# Patient Record
Sex: Female | Born: 1940
Health system: Southern US, Community
[De-identification: ages and names within clinical notes are randomized; demographics above are authoritative.]

## PROBLEM LIST (undated history)

## (undated) DIAGNOSIS — M1990 Unspecified osteoarthritis, unspecified site: Secondary | ICD-10-CM

## (undated) DIAGNOSIS — I10 Essential (primary) hypertension: Secondary | ICD-10-CM

## (undated) DIAGNOSIS — Z923 Personal history of irradiation: Secondary | ICD-10-CM

## (undated) DIAGNOSIS — C50919 Malignant neoplasm of unspecified site of unspecified female breast: Secondary | ICD-10-CM

## (undated) DIAGNOSIS — C50511 Malignant neoplasm of lower-outer quadrant of right female breast: Secondary | ICD-10-CM

## (undated) DIAGNOSIS — Z973 Presence of spectacles and contact lenses: Secondary | ICD-10-CM

## (undated) HISTORY — DX: Unspecified osteoarthritis, unspecified site: M19.90

## (undated) HISTORY — DX: Malignant neoplasm of lower-outer quadrant of right female breast: C50.511

## (undated) HISTORY — PX: TOTAL HIP ARTHROPLASTY: SHX124

## (undated) HISTORY — PX: APPENDECTOMY: SHX54

## (undated) HISTORY — DX: Essential (primary) hypertension: I10

## (undated) HISTORY — PX: BREAST LUMPECTOMY: SHX2

## (undated) HISTORY — PX: COLONOSCOPY: SHX174

## (undated) HISTORY — PX: TUBAL LIGATION: SHX77

## (undated) HISTORY — PX: BACK SURGERY: SHX140

## (undated) HISTORY — PX: BREAST BIOPSY: SHX20

---

## 2009-12-19 ENCOUNTER — Encounter: Admission: RE | Admit: 2009-12-19 | Discharge: 2009-12-19 | Payer: Self-pay | Admitting: *Deleted

## 2010-11-25 ENCOUNTER — Other Ambulatory Visit: Payer: Self-pay | Admitting: Family Medicine

## 2010-11-25 DIAGNOSIS — Z1231 Encounter for screening mammogram for malignant neoplasm of breast: Secondary | ICD-10-CM

## 2010-12-25 ENCOUNTER — Ambulatory Visit
Admission: RE | Admit: 2010-12-25 | Discharge: 2010-12-25 | Disposition: A | Discharge status: Home / Self Care | Payer: BLUE CROSS/BLUE SHIELD | Source: Ambulatory Visit | Attending: Family Medicine | Admitting: Family Medicine

## 2010-12-25 DIAGNOSIS — Z1231 Encounter for screening mammogram for malignant neoplasm of breast: Secondary | ICD-10-CM

## 2011-05-20 DIAGNOSIS — Z23 Encounter for immunization: Secondary | ICD-10-CM | POA: Diagnosis not present

## 2011-09-02 DIAGNOSIS — M1710 Unilateral primary osteoarthritis, unspecified knee: Secondary | ICD-10-CM | POA: Diagnosis not present

## 2011-11-23 DIAGNOSIS — Z23 Encounter for immunization: Secondary | ICD-10-CM | POA: Diagnosis not present

## 2011-11-23 DIAGNOSIS — L659 Nonscarring hair loss, unspecified: Secondary | ICD-10-CM | POA: Diagnosis not present

## 2011-11-23 DIAGNOSIS — Z8249 Family history of ischemic heart disease and other diseases of the circulatory system: Secondary | ICD-10-CM | POA: Diagnosis not present

## 2011-11-23 DIAGNOSIS — I10 Essential (primary) hypertension: Secondary | ICD-10-CM | POA: Diagnosis not present

## 2011-11-23 DIAGNOSIS — Z1211 Encounter for screening for malignant neoplasm of colon: Secondary | ICD-10-CM | POA: Diagnosis not present

## 2011-11-23 DIAGNOSIS — Z79899 Other long term (current) drug therapy: Secondary | ICD-10-CM | POA: Diagnosis not present

## 2011-11-23 DIAGNOSIS — E785 Hyperlipidemia, unspecified: Secondary | ICD-10-CM | POA: Diagnosis not present

## 2012-01-04 ENCOUNTER — Other Ambulatory Visit: Payer: Self-pay | Admitting: Family Medicine

## 2012-01-04 DIAGNOSIS — Z1231 Encounter for screening mammogram for malignant neoplasm of breast: Secondary | ICD-10-CM

## 2012-02-04 ENCOUNTER — Ambulatory Visit
Admission: RE | Admit: 2012-02-04 | Discharge: 2012-02-04 | Disposition: A | Discharge status: Home / Self Care | Payer: Medicare Other | Source: Ambulatory Visit | Attending: Family Medicine | Admitting: Family Medicine

## 2012-02-04 DIAGNOSIS — Z1231 Encounter for screening mammogram for malignant neoplasm of breast: Secondary | ICD-10-CM

## 2012-04-06 DIAGNOSIS — Z23 Encounter for immunization: Secondary | ICD-10-CM | POA: Diagnosis not present

## 2012-11-24 DIAGNOSIS — Z13220 Encounter for screening for lipoid disorders: Secondary | ICD-10-CM | POA: Diagnosis not present

## 2012-11-24 DIAGNOSIS — Z0000 Encounter for general adult medical examination without abnormal findings: Secondary | ICD-10-CM | POA: Diagnosis not present

## 2012-12-26 ENCOUNTER — Other Ambulatory Visit: Payer: Self-pay

## 2012-12-26 DIAGNOSIS — Z1231 Encounter for screening mammogram for malignant neoplasm of breast: Secondary | ICD-10-CM

## 2013-01-27 DIAGNOSIS — I10 Essential (primary) hypertension: Secondary | ICD-10-CM | POA: Diagnosis not present

## 2013-01-27 DIAGNOSIS — E785 Hyperlipidemia, unspecified: Secondary | ICD-10-CM | POA: Diagnosis not present

## 2013-01-27 DIAGNOSIS — Z0000 Encounter for general adult medical examination without abnormal findings: Secondary | ICD-10-CM | POA: Diagnosis not present

## 2013-02-09 DIAGNOSIS — H2510 Age-related nuclear cataract, unspecified eye: Secondary | ICD-10-CM | POA: Diagnosis not present

## 2013-02-09 DIAGNOSIS — H5200 Hypermetropia, unspecified eye: Secondary | ICD-10-CM | POA: Diagnosis not present

## 2013-02-09 DIAGNOSIS — H40019 Open angle with borderline findings, low risk, unspecified eye: Secondary | ICD-10-CM | POA: Diagnosis not present

## 2013-02-09 DIAGNOSIS — H25019 Cortical age-related cataract, unspecified eye: Secondary | ICD-10-CM | POA: Diagnosis not present

## 2013-02-10 ENCOUNTER — Ambulatory Visit
Admission: RE | Admit: 2013-02-10 | Discharge: 2013-02-10 | Disposition: A | Discharge status: Home / Self Care | Payer: Medicare Other | Source: Ambulatory Visit

## 2013-02-10 DIAGNOSIS — Z1231 Encounter for screening mammogram for malignant neoplasm of breast: Secondary | ICD-10-CM

## 2013-03-27 DIAGNOSIS — Z23 Encounter for immunization: Secondary | ICD-10-CM | POA: Diagnosis not present

## 2013-03-31 DIAGNOSIS — H40019 Open angle with borderline findings, low risk, unspecified eye: Secondary | ICD-10-CM | POA: Diagnosis not present

## 2013-04-05 DIAGNOSIS — Z1211 Encounter for screening for malignant neoplasm of colon: Secondary | ICD-10-CM | POA: Diagnosis not present

## 2013-04-05 DIAGNOSIS — K5730 Diverticulosis of large intestine without perforation or abscess without bleeding: Secondary | ICD-10-CM | POA: Diagnosis not present

## 2013-11-23 DIAGNOSIS — I10 Essential (primary) hypertension: Secondary | ICD-10-CM | POA: Diagnosis not present

## 2013-11-23 DIAGNOSIS — Z23 Encounter for immunization: Secondary | ICD-10-CM | POA: Diagnosis not present

## 2013-11-23 DIAGNOSIS — E785 Hyperlipidemia, unspecified: Secondary | ICD-10-CM | POA: Diagnosis not present

## 2013-11-23 DIAGNOSIS — IMO0002 Reserved for concepts with insufficient information to code with codable children: Secondary | ICD-10-CM | POA: Diagnosis not present

## 2013-11-23 DIAGNOSIS — Z79899 Other long term (current) drug therapy: Secondary | ICD-10-CM | POA: Diagnosis not present

## 2014-01-15 ENCOUNTER — Other Ambulatory Visit: Payer: Self-pay

## 2014-01-15 DIAGNOSIS — Z1231 Encounter for screening mammogram for malignant neoplasm of breast: Secondary | ICD-10-CM

## 2014-01-17 DIAGNOSIS — L821 Other seborrheic keratosis: Secondary | ICD-10-CM | POA: Diagnosis not present

## 2014-03-06 ENCOUNTER — Ambulatory Visit
Admission: RE | Admit: 2014-03-06 | Discharge: 2014-03-06 | Disposition: A | Discharge status: Home / Self Care | Payer: Medicare Other | Source: Ambulatory Visit

## 2014-03-06 DIAGNOSIS — Z1231 Encounter for screening mammogram for malignant neoplasm of breast: Secondary | ICD-10-CM | POA: Diagnosis not present

## 2014-03-08 ENCOUNTER — Other Ambulatory Visit: Payer: Self-pay | Admitting: Family Medicine

## 2014-03-08 DIAGNOSIS — R928 Other abnormal and inconclusive findings on diagnostic imaging of breast: Secondary | ICD-10-CM

## 2014-03-22 DIAGNOSIS — Z23 Encounter for immunization: Secondary | ICD-10-CM | POA: Diagnosis not present

## 2014-03-26 ENCOUNTER — Other Ambulatory Visit: Payer: Self-pay | Admitting: Family Medicine

## 2014-03-26 ENCOUNTER — Ambulatory Visit
Admission: RE | Admit: 2014-03-26 | Discharge: 2014-03-26 | Disposition: A | Discharge status: Home / Self Care | Payer: Medicare Other | Source: Ambulatory Visit | Attending: Family Medicine | Admitting: Family Medicine

## 2014-03-26 DIAGNOSIS — R928 Other abnormal and inconclusive findings on diagnostic imaging of breast: Secondary | ICD-10-CM

## 2014-03-26 DIAGNOSIS — N63 Unspecified lump in breast: Secondary | ICD-10-CM | POA: Diagnosis not present

## 2014-04-02 DIAGNOSIS — H25013 Cortical age-related cataract, bilateral: Secondary | ICD-10-CM | POA: Diagnosis not present

## 2014-04-02 DIAGNOSIS — H2513 Age-related nuclear cataract, bilateral: Secondary | ICD-10-CM | POA: Diagnosis not present

## 2014-04-02 DIAGNOSIS — H40013 Open angle with borderline findings, low risk, bilateral: Secondary | ICD-10-CM | POA: Diagnosis not present

## 2014-04-05 ENCOUNTER — Other Ambulatory Visit: Payer: Self-pay | Admitting: Family Medicine

## 2014-04-05 ENCOUNTER — Ambulatory Visit
Admission: RE | Admit: 2014-04-05 | Discharge: 2014-04-05 | Disposition: A | Discharge status: Home / Self Care | Payer: Medicare Other | Source: Ambulatory Visit | Attending: Family Medicine | Admitting: Family Medicine

## 2014-04-05 DIAGNOSIS — R928 Other abnormal and inconclusive findings on diagnostic imaging of breast: Secondary | ICD-10-CM

## 2014-04-05 DIAGNOSIS — N63 Unspecified lump in breast: Secondary | ICD-10-CM | POA: Diagnosis not present

## 2014-04-05 DIAGNOSIS — C50511 Malignant neoplasm of lower-outer quadrant of right female breast: Secondary | ICD-10-CM | POA: Diagnosis not present

## 2014-04-06 ENCOUNTER — Other Ambulatory Visit: Payer: Self-pay | Admitting: *Deleted

## 2014-04-06 ENCOUNTER — Telehealth: Payer: Self-pay | Admitting: *Deleted

## 2014-04-06 ENCOUNTER — Encounter: Payer: Self-pay | Admitting: *Deleted

## 2014-04-06 DIAGNOSIS — C50511 Malignant neoplasm of lower-outer quadrant of right female breast: Secondary | ICD-10-CM

## 2014-04-06 HISTORY — DX: Malignant neoplasm of lower-outer quadrant of right female breast: C50.511

## 2014-04-06 NOTE — Progress Notes (Signed)
Pt received date and time for arrival to [facility] on 04/11/14 from [facility]. I have left vm requesting pt to return call for further instructions.

## 2014-04-06 NOTE — Telephone Encounter (Signed)
Confirmed [facility] for 04/11/14 at 0800 .  Instructions and contact information given.

## 2014-04-10 NOTE — Progress Notes (Signed)
[facility] CONSULT NOTE  Patient Care Team: Antony Blackbird, MD as PCP - General (Family Medicine) Autumn Messing III, MD as Consulting Physician (General Surgery) Truitt Merle, MD as Consulting Physician (Hematology) Thea Silversmith, MD as Consulting Physician (Radiation Oncology) Trinda Pascal, NP as Nurse Practitioner (Nurse Practitioner)     Breast cancer of lower-outer quadrant of right female breast   04/06/2014 Initial Diagnosis Breast cancer of lower-outer quadrant of right female breast     CHIEF COMPLAINTS/PURPOSE OF CONSULTATION:  Newly diagnosed breast cancer   HISTORY OF PRESENTING ILLNESS:  Bonnie Maddox 73 y.o. female is here because of newly diagnosed breast cancer.   She had routine mammogram screening on 03/06/14 which showed a possible mass in right mass. She had diagnostic mammogram and [place], and [place] which showed a 19m solid mass 8:30 position of right breast 3cm from the nipple. She underwent guided biopsy which showed invasive ductal carcinoma.   She has good general health and is very physically active. She likes gardening. She feels well without any symptoms.   MEDICAL HISTORY:  Past Medical History  Diagnosis Date  . Breast cancer of lower-outer quadrant of right female breast 04/06/2014  . Hypertension   . Arthritis     SURGICAL HISTORY: Past Surgical History  Procedure Laterality Date  . Appendectomy 8    . Total hip arthroplasty 2001 and 2001      SOCIAL HISTORY: History   Social History  . Marital Status: maried     Spouse Name: PDarlyn Read    Number of Children: 2  . Years of Education: N/A   Occupational History   Retired, pHealth and safety inspector   Social History Main Topics  . Smoking status: Former Smoker -- 1.00 packs/day    Types: Cigarettes    Quit date: 04/11/1969  . Smokeless tobacco: Not on file  . Alcohol Use: Yes     Comment: 21  oz/week  . Drug Use: No  . Sexual Activity: Not on file   GYN G3P2, first  live birth [birth year] one child died in infant  OCP: 2 years  Menarche: 89LMP: 1991 HRT: no   FAMILY HISTORY: Family History  Problem Relation Age of Onset  . Breast cancer Sister 497    ALLERGIES:  has No Known Allergies.  MEDICATIONS:  She takes labetalol for hypertension, and multiple vitamin supplies including fish oil, calcium and vitamin D.  REVIEW OF SYSTEMS:   Constitutional: Denies fevers, chills or abnormal night sweats Eyes: Denies blurriness of vision, double vision or watery eyes Ears, nose, mouth, throat, and face: Denies mucositis or sore throat Respiratory: Denies cough, dyspnea or wheezes Cardiovascular: Denies palpitation, chest discomfort or lower extremity swelling Gastrointestinal:  Denies nausea, heartburn or change in bowel habits Skin: Denies abnormal skin rashes Lymphatics: Denies new lymphadenopathy or easy bruising Neurological:Denies numbness, tingling or new weaknesses Behavioral/Psych: Mood is stable, no new changes  All other systems were reviewed with the patient and are negative.  PHYSICAL EXAMINATION: ECOG PERFORMANCE STATUS: 0 - Asymptomatic  Filed Vitals:   04/11/14 0905  BP: 186/78  Pulse: 61  Temp: 97.5 F (36.4 C)  Resp: 18   Filed Weights   04/11/14 0905  Weight: 227 lb (102.967 kg)    GENERAL:alert, no distress and comfortable SKIN: skin color, texture, turgor are normal, no rashes or significant lesions EYES: normal, conjunctiva are pink and non-injected, sclera clear OROPHARYNX:no exudate, no erythema and lips, buccal mucosa, and tongue  normal  NECK: supple, thyroid normal size, non-tender, without nodularity LYMPH:  no palpable lymphadenopathy in the cervical, axillary or inguinal LUNGS: clear to auscultation and percussion with normal breathing effort HEART: regular rate & rhythm and no murmurs and no lower extremity edema ABDOMEN:abdomen soft, non-tender and normal bowel sounds Musculoskeletal:no cyanosis of digits and no  clubbing  PSYCH: alert & oriented x 3 with fluent speech NEURO: no focal motor/sensory deficits BREAST: Breast inspection showed them to be symmetrical with no nipple discharge. (+) Skin bruise at the biopsy site in right breast, a small (1cm) palpable mass in the right lower outer quadrant breast. Palpation of the left breasts and axilla revealed no obvious mass that I could appreciate.   LABORATORY DATA:  I have reviewed the data as listed Lab Results  Component Value Date   WBC 4.0 04/11/2014   HGB 12.7 04/11/2014   HCT 37.8 04/11/2014   MCV 99.2 04/11/2014   PLT 178 04/11/2014    Recent Labs  04/11/14 0827  NA 143  K 4.3  CO2 26  GLUCOSE 111  BUN 16.0  CREATININE 0.7  CALCIUM 9.5  PROT 6.9  ALBUMIN 4.0  AST 39*  ALT 34  ALKPHOS 73  BILITOT 0.57    PATHOLOGY REPORT Diagnosis 04/05/2014 Breast, right, needle core biopsy, mass, 8:30 o'clock - INVASIVE DUCTAL CARCINOMA. - SEE COMMENT. Microscopic Comment The carcinoma appears grade I. A breast prognostic profile will be performed and the results reported Separately. 90%(+), PR(-), HER2 (-)  ER   RADIOGRAPHIC STUDIES: I have personally reviewed the radiological images as listed and agreed with the findings in the report.  Mm Digital Diagnostic Unilat R 04/10/2014   Ultrasound is performed, showing an irregular hypoechoic mass with indistinct and slightly spiculated margins that measures 4 x 4 x 4 mm (millimeters).     RIGHT MAMMOGRAM  ULTRASOUND RIGHT BREAST 04/05/14 IMPRESSION: 4 mm solid mass 8:30 position right breast 3 cm from the nipple. Appearances of the mass or suspicious for malignancy.  RECOMMENDATION: Ultrasound-guided biopsy is recommended and the procedure was discussed with the patient today.   BI-RADS CATEGORY  4: Suspicious.   Mm Digital Diagnostic Unilat R 04/05/2014   IMPRESSION:  Post biopsy images demonstrate the ribbon clip to be slightly superior and approximately 5 mm medial to the  biopsied mass.  Final Assessment: Post Procedure Mammograms for Marker Placement       ASSESSMENT & PLAN:  Mrs. Kujala is a 73 year old female with past medical history of hypertension and osteoarthritis, but otherwise very healthy and active, who was found to have a right breast 4 mm mass at 8:30 position, biopsy confirmed grade 1 invasive ductal carcinoma, ER 90% positive, PR negative and HER-2 negative, Ki-67 5%. She has stage T1aN0M0  IA disease.  She has met surgeon Dr. Marlou Starks today and likely will have lumpectomy soon. We did discuss genetic counseling, given her family history of breast cancer in her sister at young age (75). She has met radiation oncologist Dr. Pablo Ledger who recommended adjuvant radiation after lumpectomy.   Given the small size of the tumor, low-grade, and strong ER positive, I did not recommend adjuvant chemotherapy. I briefly discussed the role of Oncotype test, however given the low risks, I think the Oncotype more likely will return as low score, so I don't think it's very necessary to test it.  I think she would benefit most from adjuvant hormonal therapy to reduce the cancer recurrence. This has been improved in multiple  clinical studies. She is post menopause I would recommend AI, Arimidex, after she completes adjuvant irradiation.   I plan to see her back 2-3 weeks after her surgery, review her surgical past and discuss adjuvant hormonal therapy at that time again.    All questions were answered. The patient knows to call the clinic with any problems, questions or concerns. I spent 30 minutes counseling the patient face to face. The total time spent in the appointment was 40 minutes and more than 50% was on counseling.     Truitt Merle, MD 04/11/2014 12:27 PM

## 2014-04-11 ENCOUNTER — Ambulatory Visit: Payer: Medicare Other

## 2014-04-11 ENCOUNTER — Other Ambulatory Visit (HOSPITAL_BASED_OUTPATIENT_CLINIC_OR_DEPARTMENT_OTHER): Payer: Medicare Other

## 2014-04-11 ENCOUNTER — Encounter: Payer: Self-pay | Admitting: Hematology

## 2014-04-11 ENCOUNTER — Ambulatory Visit: Payer: Medicare Other | Attending: General Surgery | Admitting: Physical Therapy

## 2014-04-11 ENCOUNTER — Ambulatory Visit
Admission: RE | Admit: 2014-04-11 | Discharge: 2014-04-11 | Disposition: A | Discharge status: Home / Self Care | Payer: Medicare Other | Source: Ambulatory Visit | Attending: Radiation Oncology | Admitting: Radiation Oncology

## 2014-04-11 ENCOUNTER — Encounter: Payer: Self-pay | Admitting: *Deleted

## 2014-04-11 ENCOUNTER — Ambulatory Visit (HOSPITAL_BASED_OUTPATIENT_CLINIC_OR_DEPARTMENT_OTHER): Payer: Medicare Other | Admitting: Hematology

## 2014-04-11 ENCOUNTER — Encounter: Payer: Self-pay | Admitting: Physical Therapy

## 2014-04-11 ENCOUNTER — Other Ambulatory Visit (INDEPENDENT_AMBULATORY_CARE_PROVIDER_SITE_OTHER): Payer: Self-pay | Admitting: General Surgery

## 2014-04-11 VITALS — BP 186/78 | HR 61 | Temp 97.5°F | Resp 18 | Ht 70.5 in | Wt 227.0 lb

## 2014-04-11 DIAGNOSIS — C50511 Malignant neoplasm of lower-outer quadrant of right female breast: Secondary | ICD-10-CM

## 2014-04-11 DIAGNOSIS — I10 Essential (primary) hypertension: Secondary | ICD-10-CM | POA: Diagnosis not present

## 2014-04-11 DIAGNOSIS — Z803 Family history of malignant neoplasm of breast: Secondary | ICD-10-CM

## 2014-04-11 DIAGNOSIS — C50911 Malignant neoplasm of unspecified site of right female breast: Secondary | ICD-10-CM

## 2014-04-11 DIAGNOSIS — Z5189 Encounter for other specified aftercare: Secondary | ICD-10-CM | POA: Diagnosis not present

## 2014-04-11 DIAGNOSIS — M439 Deforming dorsopathy, unspecified: Secondary | ICD-10-CM | POA: Diagnosis not present

## 2014-04-11 DIAGNOSIS — M1990 Unspecified osteoarthritis, unspecified site: Secondary | ICD-10-CM

## 2014-04-11 DIAGNOSIS — Z87891 Personal history of nicotine dependence: Secondary | ICD-10-CM | POA: Insufficient documentation

## 2014-04-11 DIAGNOSIS — Z170 Estrogen receptor positive status [ER+]: Secondary | ICD-10-CM | POA: Insufficient documentation

## 2014-04-11 DIAGNOSIS — Z510 Encounter for antineoplastic radiation therapy: Secondary | ICD-10-CM | POA: Insufficient documentation

## 2014-04-11 LAB — CBC WITH DIFFERENTIAL/PLATELET
BASO%: 0.3 % (ref 0.0–2.0)
Basophils Absolute: 0 10*3/uL (ref 0.0–0.1)
EOS%: 1.5 % (ref 0.0–7.0)
Eosinophils Absolute: 0.1 10*3/uL (ref 0.0–0.5)
HCT: 37.8 % (ref 34.8–46.6)
HGB: 12.7 g/dL (ref 11.6–15.9)
LYMPH%: 36.6 % (ref 14.0–49.7)
MCH: 33.3 pg (ref 25.1–34.0)
MCHC: 33.6 g/dL (ref 31.5–36.0)
MCV: 99.2 fL (ref 79.5–101.0)
MONO#: 0.3 10*3/uL (ref 0.1–0.9)
MONO%: 8.1 % (ref 0.0–14.0)
NEUT#: 2.1 10*3/uL (ref 1.5–6.5)
NEUT%: 53.5 % (ref 38.4–76.8)
Platelets: 178 10*3/uL (ref 145–400)
RBC: 3.81 10*6/uL (ref 3.70–5.45)
RDW: 13.7 % (ref 11.2–14.5)
WBC: 4 10*3/uL (ref 3.9–10.3)
lymph#: 1.5 10*3/uL (ref 0.9–3.3)

## 2014-04-11 LAB — COMPREHENSIVE METABOLIC PANEL (CC13)
ALT: 34 U/L (ref 0–55)
AST: 39 U/L — ABNORMAL HIGH (ref 5–34)
Albumin: 4 g/dL (ref 3.5–5.0)
Alkaline Phosphatase: 73 U/L (ref 40–150)
Anion Gap: 9 mEq/L (ref 3–11)
BUN: 16 mg/dL (ref 7.0–26.0)
CO2: 26 mEq/L (ref 22–29)
Calcium: 9.5 mg/dL (ref 8.4–10.4)
Chloride: 108 mEq/L (ref 98–109)
Creatinine: 0.7 mg/dL (ref 0.6–1.1)
Glucose: 111 mg/dl (ref 70–140)
Potassium: 4.3 mEq/L (ref 3.5–5.1)
Sodium: 143 mEq/L (ref 136–145)
Total Bilirubin: 0.57 mg/dL (ref 0.20–1.20)
Total Protein: 6.9 g/dL (ref 6.4–8.3)

## 2014-04-11 NOTE — Progress Notes (Signed)
Checked in new pt with no financial concerns at this time.  Pt has 2 insurances so financial assistance may not be needed but she had Raquel's card for any questions or concerns.

## 2014-04-11 NOTE — Progress Notes (Signed)
[facility] Psychosocial Distress Screening Clinical Social Work  Patient completed distress screening protocol and scored a 4 on the Psychosocial Distress Thermometer which indicates mild distress. Clinical Social Worker met with patient and patients husband in [facility] to assess for distress and other psychosocial needs.  Patient stated she was doing well and did not express any concerns at this time.  Patient stated she felt comfortable with her treatment plan after meeting with the treatment team.  CSW informed patient of the support services and support team at [facility] and encouraged her to cal with any questions or concerns.      ONCBCN DISTRESS SCREENING 04/11/2014  Screening Type Initial Screening  Distress experienced in past week (1-10) 4  Emotional problem type Adjusting to illness  Physician notified of physical symptoms Yes  Referral to clinical psychology No  Referral to clinical social work No  Referral to dietition No  Referral to support programs Yes   Johnnye Lana, MSW, LCSW, OSW-C Clinical Social Worker [place] [phone]

## 2014-04-11 NOTE — Progress Notes (Addendum)
  Radiation Oncology         [phone]) [phone] ________________________________  Initial outpatient Consultation - Date: 04/11/2014   Name: Bonnie Maddox MRN: [id number]   DOB: 1940-12-25  REFERRING PHYSICIAN: Jovita Kussmaul, MD  STAGE: Breast cancer of lower-outer quadrant of right female breast   Staging form: Breast, AJCC 7th Edition     Clinical stage from 04/11/2014: Stage IA (T1a, N0, M0) - Unsigned  HISTORY OF PRESENT ILLNESS::Bonnie Maddox is a 73 y.o. female who presented for routine mammogram on 10/20. This showed a possible mass in right mass. She had diagnostic mammogram and [place] which showed a 10m solid mass 8:30 position of right breast 3cm from the nipple. A biopsy which showed invasive ductal carcinoma this was ER+ PR- Her 2- with a Ki 67 of 5%.  She has no history of breast cancer. She is G3P2 with her first birth at 89 She had menarche at 89and underwent menopause in 89 She has not used hormone replacement therapy. She has done well since her biopsy and is accompanied by her husband.  They have moved around quite a bit most recently from [place]. Her husband's mother is still alive [place]   PREVIOUS RADIATION THERAPY: No  FAMILY HISTORY:  Family History  Problem Relation Age of Onset  . Breast cancer Sister     SOCIAL HISTORY:  History  Substance Use Topics  . Smoking status: Former Smoker -- 1.00 packs/day    Types: Cigarettes    Quit date: 04/11/1969  . Smokeless tobacco: Not on file  . Alcohol Use: Yes     Comment: 21  oz/week    REVIEW OF SYSTEMS:  A 15 point review of systems is documented in the electronic medical record. This was obtained by the nursing staff. However, I reviewed this with the patient to discuss relevant findings and make appropriate changes.  Pertinent positives are included in the chart.   PHYSICAL EXAM: There were no vitals filed for this visit.. . She is pleasant and appears younger than her stated age.  She is alert and  oriented x 3. She has no palpable abnormalities of the left or right breast. She has no palpable abnormalities of the axilla, cervical or supraclavicular regions. She has biopsy change in the lower inner quadrant with some associated bruising.   IMPRESSION: T1aN0 IDC of the left breast.   PLAN: I spoke to the patient today regarding her diagnosis and options for treatment. We discussed the equivalence in terms of survival and local failure between mastectomy and breast conservation. We discussed the role of radiation in decreasing local failures in patients who undergo lumpectomy. We discussed the process of simulation and the placement tattoos. We discussed 3 weeks of treatment as an outpatient. We discussed the possibility of asymptomatic lung damage. We discussed the low likelihood of secondary malignancies. We discussed the possible side effects including but not limited to skin redness, fatigue, permanent skin darkening, and breast swelling.    She met with surgery, physical therapy and a member of our patient and family support team.   I will meet back with her after her surgery. I told her it would be fine to delay radiation until after the first of the year if she desired.   I spent 60 minutes  face to face with the patient and more than 50% of that time was spent in counseling and/or coordination of care.   ------------------------------------------------  SThea Silversmith MD

## 2014-04-11 NOTE — Therapy (Signed)
Physical Therapy Evaluation  Patient Details  Name: Bonnie Maddox MRN: [id number] Date of Birth: 02-Dec-1940  Encounter Date: 04/11/2014      PT End of Session - 04/11/14 1129    Visit Number 1   Number of Visits 1   PT Start Time 0945   PT Stop Time 1020   PT Time Calculation (min) 35 min   Activity Tolerance Patient tolerated treatment well   Behavior During Therapy [facility] for tasks assessed/performed      Past Medical History  Diagnosis Date  . Breast cancer of lower-outer quadrant of right female breast 04/06/2014  . Hypertension   . Arthritis     Past Surgical History  Procedure Laterality Date  . Appendectomy    . Total hip arthroplasty      There were no vitals taken for this visit.  Visit Diagnosis:  Carcinoma of lower outer quadrant of right breast - Plan: PT plan of care cert/re-cert  Postural deformity - Plan: PT plan of care cert/re-cert      Subjective Assessment - 04/11/14 1115    Symptoms Patient was seen today at the [facility] for a new diagnosis of right breast cancer.   Pertinent History Patient diagnosed with right ER positive / PR negative, HER2 negative breast cancer on 04/06/14.  This is located in her right lower-outer quadrant.   Patient Stated Goals Learn post op home exercise program and reduce lymphedema risk.   Currently in Pain? Yes   Pain Score 5    Pain Location Knee   Pain Orientation Left;Right   Pain Descriptors / Indicators Aching   Pain Type Chronic pain   Pain Onset More than a month ago   Pain Frequency Intermittent   Aggravating Factors  sitting   Pain Relieving Factors moving around   Effect of Pain on Daily Activities Patient reports her knees get very stiff after prolonged sitting          [facility] PT Assessment - 04/11/14 0001    Assessment   Medical Diagnosis right breast cancer   Onset Date 04/06/14   Precautions   Precautions Other (comment)  Active breast cancer; dementia; fall risk   Restrictions   Weight Bearing Restrictions No   Balance Screen   Has the patient fallen in the past 6 months No  She feels unsteady due to knee pain but denies falls   Has the patient had a decrease in activity level because of a fear of falling?  No   Is the patient reluctant to leave their home because of a fear of falling?  No   Home Environment   Living Enviornment Private residence   Living Arrangements Spouse/significant other   Available Help at Discharge Family   Prior Function   Level of Independence Independent with basic ADLs   Vocation Retired  Retired gardener   Leisure gardening; Engineer, maintenance (IT) videos 3-4x/wk; bike   Cognition   Overall Cognitive Status Within Functional Limits for tasks assessed   Posture/Postural Control   Posture/Postural Control Postural limitations   Postural Limitations Forward head;Rounded Shoulders   AROM   Right Shoulder Extension 46 Degrees   Right Shoulder Flexion 154 Degrees   Right Shoulder ABduction 152 Degrees   Right Shoulder Internal Rotation 75 Degrees   Right Shoulder External Rotation 7 Degrees   Left Shoulder Extension 64 Degrees   Left Shoulder Flexion 139 Degrees   Left Shoulder ABduction 140 Degrees   Left Shoulder Internal Rotation 67 Degrees  Left Shoulder External Rotation 72 Degrees            PT Education - 06-May-2014 1128    Education provided Yes   Education Details Post op shoulder ROM home exercises; lymphedema risk reduction practices   Person(s) Educated Patient   Methods Explanation;Demonstration;Verbal cues;Handout   Comprehension Verbalized understanding              Plan - 2014-05-06 1129    Clinical Impression Statement Patient was seen today for a baseline evaluation for right breast cancer.  She is planning to have a right lumpectomy and sentinel node biopsy followed by radiation therapy and anti-estrogen therapy.  She may benefit from physical therapy after surgery to regain shoulder range of  motion or to address lymphedema concerns.   Pt will benefit from skilled therapeutic intervention in order to improve on the following deficits Decreased strength;Impaired UE functional use;Pain;Decreased knowledge of precautions;Increased edema;Decreased range of motion  if needed following surgery.   Rehab Potential Excellent   Clinical Impairments Affecting Rehab Potential None   PT Frequency One time visit   PT Treatment/Interventions Therapeutic exercise;Patient/family education   Consulted and Agree with Plan of Care Patient;Family member/caregiver   Family Member Consulted Patient's husband          G-Codes - 05-06-14 1134    Functional Assessment Tool Used Clinical Judgement   Functional Limitation Other PT primary   Other PT Primary Current Status [phone]) At least 1 percent but less than 20 percent impaired, limited or restricted   Other PT Primary Goal Status (Y6063) At least 1 percent but less than 20 percent impaired, limited or restricted   Other PT Primary Discharge Status (K1601) At least 1 percent but less than 20 percent impaired, limited or restricted      Problem List Patient Active Problem List   Diagnosis Date Noted  . Breast cancer of lower-outer quadrant of right female breast 04/06/2014            LYMPHEDEMA/ONCOLOGY QUESTIONNAIRE - 2014/05/06 1124    Type   Cancer Type Right breast   Lymphedema Assessments   Lymphedema Assessments Upper extremities   Right Upper Extremity Lymphedema   10 cm Proximal to Olecranon Process 30.5 cm   Olecranon Process 27.2 cm   10 cm Proximal to Ulnar Styloid Process 23.9 cm   Just Proximal to Ulnar Styloid Process 17.8 cm   Across Hand at [employer] 20.4 cm   At [place] of 2nd Digit 7.2 cm   Left Upper Extremity Lymphedema   10 cm Proximal to Olecranon Process 31.3 cm   Olecranon Process 27.5 cm   10 cm Proximal to Ulnar Styloid Process 24.1 cm   Just Proximal to Ulnar Styloid Process 17.5 cm   Across Hand at  [employer] 19.8 cm   At [place] of 2nd Digit 6.8 cm            Breast Clinic Goals - May 06, 2014 1133    Patient will be able to verbalize understanding of pertinent lymphedema risk reduction practices relevant to her diagnosis specifically related to skin care.   Time 1   Period Days   Status New   Patient will be able to return demonstrate and/or verbalize understanding of the post-op home exercise program related to regaining shoulder range of motion.   Time 1   Period Days   Status New   Patient will be able to verbalize understanding of the importance of attending the postoperative  After Breast Cancer Class for further lymphedema risk reduction education and therapeutic exercise.   Time 1   Period Days   Status New         Aliesha Dolata,MARTI COOPER, PT 04/11/2014, 11:39 AM

## 2014-04-11 NOTE — Progress Notes (Signed)
Bonnie Maddox is a very pleasant 73 y.o.Marland Kitchen female from [place], [place] with newly diagnosed grade 1 invasive ductal carcinoma of the right breast.  Biopsy results also revealed pathology indicating the tumor is ER positive, PR negative, and HER2/neu negative. Ki67 is 5%.  She presents today with her husband to the [facility]) for treatment consideration and recommendations from the breast surgeon, radiation oncologist, and medical oncologist.     I briefly met with Bonnie Maddox and her husband  during her [facility] visit today. We discussed the purpose of the Survivorship Clinic, which will include monitoring for recurrence, coordinating completion of age and gender-appropriate cancer screenings, promotion of overall wellness, as well as managing potential late/long-term side effects of anti-cancer treatments.    As of today, the treatment plan for Bonnie Maddox will likely include surgery and radiation therapy.  She will also meet with the [employer]. Endocrine therapy with an aromatase inhibitor will be considered for her as well.The intent of treatment for Bonnie Maddox is cure, therefore she will be eligible for the Survivorship Clinic upon her completion of treatment.  Her survivorship care plan (SCP) document will be drafted and updated throughout the course of her treatment trajectory. She will receive the SCP in an office visit with myself in the Survivorship Clinic once she has completed treatment.   Bonnie Maddox was encouraged to ask questions and all questions were answered to her satisfaction.  She was given my business card and encouraged to contact me with any concerns regarding survivorship.  I look forward to  participating in her care.

## 2014-04-11 NOTE — Patient Instructions (Signed)

## 2014-04-16 ENCOUNTER — Telehealth: Payer: Self-pay | Admitting: *Deleted

## 2014-04-16 NOTE — Telephone Encounter (Signed)
Spoke with patient from [facility] 04/11/14.  She is doing well just awaiting her surgery date. Informed her I would check with [facility] about the surgery date. Encouraged her to call with any needs or concerns.

## 2014-04-18 ENCOUNTER — Other Ambulatory Visit (INDEPENDENT_AMBULATORY_CARE_PROVIDER_SITE_OTHER): Payer: Self-pay | Admitting: General Surgery

## 2014-04-18 DIAGNOSIS — C50511 Malignant neoplasm of lower-outer quadrant of right female breast: Secondary | ICD-10-CM

## 2014-04-23 ENCOUNTER — Encounter: Payer: Medicare Other | Admitting: Genetic Counselor

## 2014-04-23 ENCOUNTER — Other Ambulatory Visit: Payer: Medicare Other

## 2014-04-25 ENCOUNTER — Telehealth: Payer: Self-pay | Admitting: *Deleted

## 2014-04-25 NOTE — Telephone Encounter (Signed)
Called and spoke with patient and confirmed follow up appointment with Dr. Burr Medico for 06/07/14 at 1030am.

## 2014-05-14 ENCOUNTER — Encounter (HOSPITAL_BASED_OUTPATIENT_CLINIC_OR_DEPARTMENT_OTHER): Payer: Self-pay | Admitting: *Deleted

## 2014-05-14 NOTE — Progress Notes (Signed)
To come in for ekg-bmet after seeds 12/30

## 2014-05-16 ENCOUNTER — Ambulatory Visit
Admission: RE | Admit: 2014-05-16 | Discharge: 2014-05-16 | Disposition: A | Discharge status: Home / Self Care | Payer: Medicare Other | Source: Ambulatory Visit | Attending: General Surgery | Admitting: General Surgery

## 2014-05-16 ENCOUNTER — Encounter (HOSPITAL_BASED_OUTPATIENT_CLINIC_OR_DEPARTMENT_OTHER)
Admission: RE | Admit: 2014-05-16 | Discharge: 2014-05-16 | Disposition: A | Discharge status: Home / Self Care | Payer: Medicare Other | Source: Ambulatory Visit | Attending: General Surgery | Admitting: General Surgery

## 2014-05-16 DIAGNOSIS — C50511 Malignant neoplasm of lower-outer quadrant of right female breast: Secondary | ICD-10-CM

## 2014-05-16 DIAGNOSIS — R9431 Abnormal electrocardiogram [ECG] [EKG]: Secondary | ICD-10-CM | POA: Diagnosis not present

## 2014-05-16 DIAGNOSIS — M1990 Unspecified osteoarthritis, unspecified site: Secondary | ICD-10-CM | POA: Diagnosis not present

## 2014-05-16 DIAGNOSIS — I10 Essential (primary) hypertension: Secondary | ICD-10-CM | POA: Diagnosis not present

## 2014-05-16 DIAGNOSIS — Z87891 Personal history of nicotine dependence: Secondary | ICD-10-CM | POA: Diagnosis not present

## 2014-05-16 DIAGNOSIS — C50911 Malignant neoplasm of unspecified site of right female breast: Secondary | ICD-10-CM | POA: Diagnosis not present

## 2014-05-16 LAB — BASIC METABOLIC PANEL
Anion gap: 8 (ref 5–15)
BUN: 12 mg/dL (ref 6–23)
CO2: 26 mmol/L (ref 19–32)
Calcium: 9.2 mg/dL (ref 8.4–10.5)
Chloride: 105 mEq/L (ref 96–112)
Creatinine, Ser: 0.52 mg/dL (ref 0.50–1.10)
GFR calc Af Amer: >90 mL/min (ref >90)
GFR calc non Af Amer: >90 mL/min (ref >90)
Glucose, Bld: 93 mg/dL (ref 70–99)
Potassium: 4.5 mmol/L (ref 3.5–5.1)
Sodium: 139 mmol/L (ref 135–145)

## 2014-05-16 NOTE — Progress Notes (Signed)
EKG cleared by Dr Al Corpus

## 2014-05-17 ENCOUNTER — Encounter (HOSPITAL_BASED_OUTPATIENT_CLINIC_OR_DEPARTMENT_OTHER): Payer: Self-pay | Admitting: Anesthesiology

## 2014-05-17 ENCOUNTER — Ambulatory Visit
Admission: RE | Admit: 2014-05-17 | Discharge: 2014-05-17 | Disposition: A | Discharge status: Home / Self Care | Payer: Medicare Other | Source: Ambulatory Visit | Attending: General Surgery | Admitting: General Surgery

## 2014-05-17 ENCOUNTER — Ambulatory Visit (HOSPITAL_BASED_OUTPATIENT_CLINIC_OR_DEPARTMENT_OTHER): Payer: Medicare Other | Admitting: Anesthesiology

## 2014-05-17 ENCOUNTER — Ambulatory Visit (HOSPITAL_BASED_OUTPATIENT_CLINIC_OR_DEPARTMENT_OTHER)
Admission: RE | Admit: 2014-05-17 | Discharge: 2014-05-17 | Disposition: A | Discharge status: Home / Self Care | Payer: Medicare Other | Source: Ambulatory Visit | Attending: General Surgery | Admitting: General Surgery

## 2014-05-17 ENCOUNTER — Encounter (HOSPITAL_COMMUNITY)
Admission: RE | Admit: 2014-05-17 | Discharge: 2014-05-17 | Disposition: A | Discharge status: Home / Self Care | Payer: Medicare Other | Source: Ambulatory Visit | Attending: General Surgery | Admitting: General Surgery

## 2014-05-17 ENCOUNTER — Encounter (HOSPITAL_BASED_OUTPATIENT_CLINIC_OR_DEPARTMENT_OTHER)
Admission: RE | Disposition: A | Discharge status: Home / Self Care | Payer: Self-pay | Source: Ambulatory Visit | Attending: General Surgery

## 2014-05-17 DIAGNOSIS — C50511 Malignant neoplasm of lower-outer quadrant of right female breast: Secondary | ICD-10-CM | POA: Insufficient documentation

## 2014-05-17 DIAGNOSIS — M1990 Unspecified osteoarthritis, unspecified site: Secondary | ICD-10-CM | POA: Diagnosis not present

## 2014-05-17 DIAGNOSIS — I10 Essential (primary) hypertension: Secondary | ICD-10-CM | POA: Insufficient documentation

## 2014-05-17 DIAGNOSIS — G8918 Other acute postprocedural pain: Secondary | ICD-10-CM | POA: Diagnosis not present

## 2014-05-17 DIAGNOSIS — Z87891 Personal history of nicotine dependence: Secondary | ICD-10-CM | POA: Insufficient documentation

## 2014-05-17 DIAGNOSIS — R079 Chest pain, unspecified: Secondary | ICD-10-CM | POA: Diagnosis not present

## 2014-05-17 DIAGNOSIS — R9431 Abnormal electrocardiogram [ECG] [EKG]: Secondary | ICD-10-CM | POA: Diagnosis not present

## 2014-05-17 DIAGNOSIS — C50911 Malignant neoplasm of unspecified site of right female breast: Secondary | ICD-10-CM | POA: Diagnosis not present

## 2014-05-17 DIAGNOSIS — C50912 Malignant neoplasm of unspecified site of left female breast: Secondary | ICD-10-CM | POA: Diagnosis not present

## 2014-05-17 HISTORY — PX: BREAST LUMPECTOMY WITH NEEDLE LOCALIZATION AND AXILLARY SENTINEL LYMPH NODE BX: SHX5760

## 2014-05-17 HISTORY — DX: Presence of spectacles and contact lenses: Z97.3

## 2014-05-17 LAB — POCT HEMOGLOBIN-HEMACUE: Hemoglobin: 13.9 g/dL (ref 12.0–15.0)

## 2014-05-17 SURGERY — BREAST LUMPECTOMY WITH NEEDLE LOCALIZATION AND AXILLARY SENTINEL LYMPH NODE BX
Anesthesia: Regional | Site: Breast | Laterality: Right

## 2014-05-17 MED ORDER — BUPIVACAINE-EPINEPHRINE (PF) 0.5% -1:200000 IJ SOLN
INTRAMUSCULAR | Status: DC | PRN
  Administered 2014-05-17: 30 mL via PERINEURAL

## 2014-05-17 MED ORDER — FENTANYL CITRATE 0.05 MG/ML IJ SOLN
INTRAMUSCULAR | Status: AC
  Filled 2014-05-17: qty 6

## 2014-05-17 MED ORDER — GLYCOPYRROLATE 0.2 MG/ML IJ SOLN
INTRAMUSCULAR | Status: DC | PRN
Start: 2014-05-17 — End: 2014-05-17
  Administered 2014-05-17 (×2): 0.2 mg via INTRAVENOUS

## 2014-05-17 MED ORDER — SODIUM CHLORIDE 0.9 % IJ SOLN
INTRAMUSCULAR | Status: AC
  Filled 2014-05-17: qty 10

## 2014-05-17 MED ORDER — CEFAZOLIN SODIUM-DEXTROSE 2-3 GM-% IV SOLR
INTRAVENOUS | Status: AC
  Filled 2014-05-17: qty 50

## 2014-05-17 MED ORDER — PROPOFOL 10 MG/ML IV BOLUS
INTRAVENOUS | Status: DC | PRN
  Administered 2014-05-17: 200 mg via INTRAVENOUS
  Administered 2014-05-17: 100 mg via INTRAVENOUS

## 2014-05-17 MED ORDER — OXYCODONE-ACETAMINOPHEN 5-325 MG PO TABS
1.0000 | ORAL_TABLET | ORAL | Status: DC | PRN
Start: 2014-05-17 — End: 2014-06-26

## 2014-05-17 MED ORDER — LIDOCAINE HCL (CARDIAC) 20 MG/ML IV SOLN
INTRAVENOUS | Status: DC | PRN
  Administered 2014-05-17: 50 mg via INTRAVENOUS

## 2014-05-17 MED ORDER — TECHNETIUM TC 99M SULFUR COLLOID FILTERED
1.0000 | Freq: Once | INTRAVENOUS | Status: AC | PRN
  Administered 2014-05-17: 1 via INTRADERMAL

## 2014-05-17 MED ORDER — FENTANYL CITRATE 0.05 MG/ML IJ SOLN
INTRAMUSCULAR | Status: AC
  Filled 2014-05-17: qty 2

## 2014-05-17 MED ORDER — LACTATED RINGERS IV SOLN
INTRAVENOUS | Status: DC
  Administered 2014-05-17: 08:00:00 via INTRAVENOUS

## 2014-05-17 MED ORDER — BUPIVACAINE HCL (PF) 0.25 % IJ SOLN
INTRAMUSCULAR | Status: AC
  Filled 2014-05-17: qty 30

## 2014-05-17 MED ORDER — FENTANYL CITRATE 0.05 MG/ML IJ SOLN
50.0000 ug | INTRAMUSCULAR | Status: DC | PRN
  Administered 2014-05-17 (×2): 50 ug via INTRAVENOUS

## 2014-05-17 MED ORDER — BUPIVACAINE HCL (PF) 0.25 % IJ SOLN
INTRAMUSCULAR | Status: DC | PRN
  Administered 2014-05-17: 30 mL

## 2014-05-17 MED ORDER — CEFAZOLIN SODIUM-DEXTROSE 2-3 GM-% IV SOLR
INTRAVENOUS | Status: DC | PRN
  Administered 2014-05-17: 2 g via INTRAVENOUS

## 2014-05-17 MED ORDER — ONDANSETRON HCL 4 MG/2ML IJ SOLN: 4.0000 mg | Freq: Four times a day (QID) | INTRAMUSCULAR | Status: DC | PRN

## 2014-05-17 MED ORDER — DEXAMETHASONE SODIUM PHOSPHATE 4 MG/ML IJ SOLN
INTRAMUSCULAR | Status: DC | PRN
  Administered 2014-05-17: 10 mg via INTRAVENOUS

## 2014-05-17 MED ORDER — CHLORHEXIDINE GLUCONATE 4 % EX LIQD: 1.0000 "application " | Freq: Once | CUTANEOUS | Status: DC

## 2014-05-17 MED ORDER — ONDANSETRON HCL 4 MG/2ML IJ SOLN
INTRAMUSCULAR | Status: DC | PRN
  Administered 2014-05-17: 4 mg via INTRAVENOUS

## 2014-05-17 MED ORDER — CEFAZOLIN SODIUM-DEXTROSE 2-3 GM-% IV SOLR: 2.0000 g | INTRAVENOUS | Status: DC

## 2014-05-17 MED ORDER — BUPIVACAINE-EPINEPHRINE (PF) 0.25% -1:200000 IJ SOLN
INTRAMUSCULAR | Status: AC
  Filled 2014-05-17: qty 30

## 2014-05-17 MED ORDER — METHYLENE BLUE 1 % INJ SOLN
INTRAMUSCULAR | Status: AC
  Filled 2014-05-17: qty 10

## 2014-05-17 MED ORDER — HYDROMORPHONE HCL 1 MG/ML IJ SOLN: 0.2500 mg | INTRAMUSCULAR | Status: DC | PRN

## 2014-05-17 MED ORDER — MIDAZOLAM HCL 2 MG/2ML IJ SOLN
INTRAMUSCULAR | Status: AC
  Filled 2014-05-17: qty 2

## 2014-05-17 MED ORDER — MIDAZOLAM HCL 2 MG/2ML IJ SOLN
1.0000 mg | INTRAMUSCULAR | Status: DC | PRN
  Administered 2014-05-17 (×2): 1 mg via INTRAVENOUS

## 2014-05-17 SURGICAL SUPPLY — 43 items
APPLIER CLIP 11 MED OPEN (CLIP) ×2
BLADE SURG 15 STRL LF DISP TIS (BLADE) ×1 IMPLANT
BLADE SURG 15 STRL SS (BLADE) ×1
CANISTER SUCT 1200ML W/VALVE (MISCELLANEOUS) IMPLANT
CHLORAPREP W/TINT 26ML (MISCELLANEOUS) ×2 IMPLANT
CLIP APPLIE 11 MED OPEN (CLIP) ×1 IMPLANT
COVER BACK TABLE 60X90IN (DRAPES) ×2 IMPLANT
COVER MAYO STAND STRL (DRAPES) ×2 IMPLANT
COVER PROBE W GEL 5X96 (DRAPES) ×2 IMPLANT
DECANTER SPIKE VIAL GLASS SM (MISCELLANEOUS) IMPLANT
DEVICE DUBIN W/COMP PLATE 8390 (MISCELLANEOUS) ×2 IMPLANT
DRAPE LAPAROSCOPIC ABDOMINAL (DRAPES) ×2 IMPLANT
DRAPE UTILITY XL STRL (DRAPES) ×2 IMPLANT
ELECT COATED BLADE 2.86 ST (ELECTRODE) ×2 IMPLANT
ELECT REM PT RETURN 9FT ADLT (ELECTROSURGICAL) ×2
ELECTRODE REM PT RTRN 9FT ADLT (ELECTROSURGICAL) ×1 IMPLANT
GLOVE BIO SURGEON STRL SZ 6.5 (GLOVE) ×4 IMPLANT
GLOVE BIO SURGEON STRL SZ7.5 (GLOVE) ×4 IMPLANT
GLOVE BIOGEL PI IND STRL 7.0 (GLOVE) ×1 IMPLANT
GLOVE BIOGEL PI INDICATOR 7.0 (GLOVE) ×1
GOWN STRL REUS W/ TWL LRG LVL3 (GOWN DISPOSABLE) ×2 IMPLANT
GOWN STRL REUS W/TWL LRG LVL3 (GOWN DISPOSABLE) ×2
KIT MARKER MARGIN INK (KITS) ×2 IMPLANT
LIQUID BAND (GAUZE/BANDAGES/DRESSINGS) ×2 IMPLANT
NDL SAFETY ECLIPSE 18X1.5 (NEEDLE) IMPLANT
NEEDLE HYPO 18GX1.5 SHARP (NEEDLE)
NEEDLE HYPO 25X1 1.5 SAFETY (NEEDLE) ×2 IMPLANT
NS IRRIG 1000ML POUR BTL (IV SOLUTION) ×2 IMPLANT
PACK BASIN DAY SURGERY FS (CUSTOM PROCEDURE TRAY) ×2 IMPLANT
PENCIL BUTTON HOLSTER BLD 10FT (ELECTRODE) ×2 IMPLANT
SLEEVE SCD COMPRESS KNEE MED (MISCELLANEOUS) ×2 IMPLANT
SPONGE LAP 18X18 X RAY DECT (DISPOSABLE) ×2 IMPLANT
STAPLER VISISTAT 35W (STAPLE) IMPLANT
SUT MON AB 4-0 PC3 18 (SUTURE) ×4 IMPLANT
SUT SILK 3 0 PS 1 (SUTURE) IMPLANT
SUT VIC AB 3-0 54X BRD REEL (SUTURE) ×2 IMPLANT
SUT VIC AB 3-0 BRD 54 (SUTURE) ×2
SUT VICRYL 3-0 CR8 SH (SUTURE) ×2 IMPLANT
SYR CONTROL 10ML LL (SYRINGE) ×2 IMPLANT
TOWEL OR 17X24 6PK STRL BLUE (TOWEL DISPOSABLE) ×2 IMPLANT
TOWEL OR NON WOVEN STRL DISP B (DISPOSABLE) IMPLANT
TUBE CONNECTING 20X1/4 (TUBING) IMPLANT
YANKAUER SUCT BULB TIP NO VENT (SUCTIONS) IMPLANT

## 2014-05-17 NOTE — Progress Notes (Signed)
  Assisted Dr. Hodierne with right, ultrasound guided, pectoralis block. Side rails up, monitors on throughout procedure. See vital signs in flow sheet. Tolerated Procedure well. 

## 2014-05-17 NOTE — Anesthesia Postprocedure Evaluation (Signed)
Anesthesia Post Note  Patient: Bonnie Maddox  Procedure(s) Performed: Procedure(s) (LRB): RIGHT BREAST LUMPECTOMY WITH RADIOACTIVE SEED LOCALIZATION AND SENTINEL NODE MAPPING (Right)  Anesthesia type: General  Patient location: PACU  Post pain: Pain level controlled and Adequate analgesia  Post assessment: Post-op Vital signs reviewed, Patient's Cardiovascular Status Stable, Respiratory Function Stable, Patent Airway and Pain level controlled  Last Vitals:  Filed Vitals:   05/17/14 1144  BP: 187/70  Pulse: 61  Temp: 37 C  Resp: 16    Post vital signs: Reviewed and stable  Level of consciousness: awake, alert  and oriented  Complications: No apparent anesthesia complications

## 2014-05-17 NOTE — Progress Notes (Signed)
Nuc med injection performed by Denny Peon. Pt tol well after fentanyl and versed for sedation. VSS (see doc flowsheets)

## 2014-05-17 NOTE — Op Note (Signed)
05/17/2014  10:37 AM  PATIENT:  Bonnie Maddox  73 y.o. female  PRE-OPERATIVE DIAGNOSIS:  Right Breast Cancer  POST-OPERATIVE DIAGNOSIS:  Right Breast Cancer  PROCEDURE:  Procedure(s): RIGHT BREAST LUMPECTOMY WITH RADIOACTIVE SEED LOCALIZATION AND SENTINEL NODE MAPPING (Right)  SURGEON:  Surgeon(s) and Role:    * Jovita Kussmaul, MD - Primary  PHYSICIAN ASSISTANT:   ASSISTANTS: none   ANESTHESIA:   general  EBL:  Total I/O In: 900 [I.V.:900] Out: -   BLOOD ADMINISTERED:none  DRAINS: none   LOCAL MEDICATIONS USED:  MARCAINE     SPECIMEN:  Source of Specimen:  right breast tissue and sentinel node  DISPOSITION OF SPECIMEN:  PATHOLOGY  COUNTS:  YES  TOURNIQUET:  * No tourniquets in log *  DICTATION: .Dragon Dictation  After informed consent was obtained the patient was brought to the operating room and placed in the supine position on the operating room table. After adequate induction of general anesthesia the patient's right chest, breast, and axillary area were prepped with ChloraPrep, allowed to dry, and draped in usual sterile manner. Previously a I-125 radioactive seed had been placed in the lower outer quadrant of the right breast to mark the area of the cancer. Earlier in the day, 1 mCi of technetium sulfur colloid was also placed in the subareolar position on the right. At this point attention was first turned to the right axilla. The neoprobe device set on technetium was used to identify a hot spot in the right axilla. A small transversely oriented incision was made with a 15 blade knife overlying the hot spot. This incision was carried through the skin and subcutaneous tissue sharply with electrocautery until the axilla was entered. Using the neoprobe to direct blunt hemostat dissection a lymph node was identified. The lymph node was excised sharply with electrocautery and the lymphatics were clamped with hemostats, divided, and ligated with 3-0 Vicryl ties. Ex vivo  counts on the lymph node were approximately 200. No other hot or palpable lymph nodes were identified in the right axilla. This was sent as sentinel node #1 to pathology. The wound was infiltrated with quarter percent Marcaine. The deep layer of the wound was then closed with interrupted 0 black stitches. The skin was then closed with a running 4-0 Monocryl subcuticular stitch. Attention was then turned to the right breast. The neoprobe was set to I-125 in the area of radioactivity was identified. A radial type incision was made with a 15 blade knife overlying the hot spot. While frequently checking the position of the radioactivity a circular portion of breast tissue was excised sharply around the area of radioactivity. Once the specimen was removed it was oriented with the appropriate paint colors. The radioactive seed was confirmed in the specimen and a lack of radioactivity was also confirmed in the breast. A specimen radiograph showed the clip and seed to be in the center of the specimen. The specimen was then sent to pathology for further evaluation and we did get confirmation that the seed was present in pathology. The wound was then infiltrated with quarter percent Marcaine. The deep layer of the wound was then closed with interrupted 3-0 Vicryl stitches. The skin was then closed with interrupted 4-0 Monocryl subcuticular stitches. Dermabond dressings were applied. At the end of the case all needle sponge and instrument counts were correct. The patient was then awakened and taken to recovery in stable condition. The patient tolerated the procedure well.  PLAN OF CARE: Discharge to  home after PACU  PATIENT DISPOSITION:  PACU - hemodynamically stable.   Delay start of Pharmacological VTE agent (>24hrs) due to surgical blood loss or risk of bleeding: not applicable

## 2014-05-17 NOTE — Discharge Instructions (Signed)
°  Post Anesthesia Home Care Instructions ° °Activity: °Get plenty of rest for the remainder of the day. A responsible adult should stay with you for 24 hours following the procedure.  °For the next 24 hours, DO NOT: °-Drive a car °-Operate machinery °-Drink alcoholic beverages °-Take any medication unless instructed by your physician °-Make any legal decisions or sign important papers. ° °Meals: °Start with liquid foods such as gelatin or soup. Progress to regular foods as tolerated. Avoid greasy, spicy, heavy foods. If nausea and/or vomiting occur, drink only clear liquids until the nausea and/or vomiting subsides. Call your physician if vomiting continues. ° °Special Instructions/Symptoms: °Your throat may feel dry or sore from the anesthesia or the breathing tube placed in your throat during surgery. If this causes discomfort, gargle with warm salt water. The discomfort should disappear within 24 hours. ° °Regional Anesthesia Blocks ° °1. Numbness or the inability to move the "blocked" extremity may last from 3-48 hours after placement. The length of time depends on the medication injected and your individual response to the medication. If the numbness is not going away after 48 hours, call your surgeon. ° °2. The extremity that is blocked will need to be protected until the numbness is gone and the  Strength has returned. Because you cannot feel it, you will need to take extra care to avoid injury. Because it may be weak, you may have difficulty moving it or using it. You may not know what position it is in without looking at it while the block is in effect. ° °3. For blocks in the legs and feet, returning to weight bearing and walking needs to be done carefully. You will need to wait until the numbness is entirely gone and the strength has returned. You should be able to move your leg and foot normally before you try and bear weight or walk. You will need someone to be with you when you first try to ensure you  do not fall and possibly risk injury. ° °4. Bruising and tenderness at the needle site are common side effects and will resolve in a few days. ° °5. Persistent numbness or new problems with movement should be communicated to the surgeon or the [facility] ([phone])/ [facility] ([phone]). °

## 2014-05-17 NOTE — Anesthesia Procedure Notes (Addendum)
Anesthesia Regional Block:  Pectoralis block  Pre-Anesthetic Checklist: ,, timeout performed, Correct Patient, Correct Site, Correct Laterality, Correct Procedure, Correct Position, site marked, Risks and benefits discussed,  Surgical consent,  Pre-op evaluation,  At surgeon's request and post-op pain management  Laterality: Right  Prep: chloraprep       Needles:  Injection technique: Single-shot  Needle Type: Echogenic Needle     Needle Length: 9cm 9 cm Needle Gauge: 21 and 21 G    Additional Needles:  Procedures: ultrasound guided (picture in chart) Pectoralis block Narrative:  Start time: 05/17/2014 8:52 AM End time: 05/17/2014 9:03 AM Injection made incrementally with aspirations every 5 mL.  Performed by: Personally  Anesthesiologist: HODIERNE, ADAM  Additional Notes: Pt tolerate the procedure well.   Procedure Name: LMA Insertion Date/Time: 05/17/2014 9:29 AM Performed by: Toula Moos L Pre-anesthesia Checklist: Patient identified, Emergency Drugs available, Suction available and Patient being monitored Patient Re-evaluated:Patient Re-evaluated prior to inductionOxygen Delivery Method: Circle System Utilized Preoxygenation: Pre-oxygenation with 100% oxygen Intubation Type: IV induction Ventilation: Mask ventilation without difficulty LMA: LMA inserted LMA Size: 4.0 Number of attempts: 1 Airway Equipment and Method: bite block Placement Confirmation: positive ETCO2 Tube secured with: Tape Dental Injury: Teeth and Oropharynx as per pre-operative assessment

## 2014-05-17 NOTE — H&P (Signed)
Bonnie Maddox. Silliman 04/11/2014 9:44 AM Location: [facility] Patient #: [id number] DOB: 10-25-1940 Undefined / Language: Suszanne Conners / Race: Undefined Female  History of Present Illness Sammuel Hines. Marlou Starks MD; 04/11/2014 9:45 AM) The patient is a 73 year old female who presents with breast cancer. We are asked to see the patient in consultation by Dr. Luberta Robertson to evaluate her for a new right breast cancer. The patient is a 73 year old white female who recently went for a routine screening mammogram. At that time she was found to have a abnormality in the lower outer quadrant of the right breast. This measured 4 mm by ultrasound. It was biopsied and came back as an invasive breast cancer. It was a grade 1 ER positive PR negative HER-2 negative with a Ki-67 of 5%. She denies any breast pain or discharge from her nipple. She does have a sister that has had bilateral breast cancer in [place]. Her sister is a smoker. The patient herself quit smoking when she was 73 years old.   Review of Systems Eddie Dibbles S. Marlou Starks MD; 04/11/2014 9:45 AM) General Not Present- Appetite Loss, Chills, Fatigue, Fever, Night Sweats, Weight Gain and Weight Loss. Skin Not Present- Change in Wart/Mole, Dryness, Hives, Jaundice, New Lesions, Non-Healing Wounds, Rash and Ulcer. HEENT Not Present- Earache, Hearing Loss, Hoarseness, Nose Bleed, Oral Ulcers, Ringing in the Ears, Seasonal Allergies, Sinus Pain, Sore Throat, Visual Disturbances, Wears glasses/contact lenses and Yellow Eyes. Respiratory Not Present- Bloody sputum, Chronic Cough, Difficulty Breathing, Snoring and Wheezing. Breast Not Present- Breast Mass, Breast Pain, Nipple Discharge and Skin Changes. Cardiovascular Not Present- Chest Pain, Difficulty Breathing Lying Down, Leg Cramps, Palpitations, Rapid Heart Rate, Shortness of Breath and Swelling of Extremities. Gastrointestinal Not Present- Abdominal Pain, Bloating, Bloody Stool, Change in Bowel Habits, Chronic  diarrhea, Constipation, Difficulty Swallowing, Excessive gas, Gets full quickly at meals, Hemorrhoids, Indigestion, Nausea, Rectal Pain and Vomiting. Female Genitourinary Not Present- Frequency, Nocturia, Painful Urination, Pelvic Pain and Urgency. Musculoskeletal Not Present- Back Pain, Joint Pain, Joint Stiffness, Muscle Pain, Muscle Weakness and Swelling of Extremities. Neurological Not Present- Decreased Memory, Fainting, Headaches, Numbness, Seizures, Tingling, Tremor, Trouble walking and Weakness. Psychiatric Not Present- Anxiety, Bipolar, Change in Sleep Pattern, Depression, Fearful and Frequent crying. Endocrine Not Present- Cold Intolerance, Excessive Hunger, Hair Changes, Heat Intolerance, Hot flashes and New Diabetes. Hematology Not Present- Easy Bruising, Excessive bleeding, Gland problems, HIV and Persistent Infections.   Physical Exam Eddie Dibbles S. Marlou Starks MD; 04/11/2014 9:46 AM) General Mental Status-Alert. General Appearance-Consistent with stated age. Hydration-Well hydrated. Voice-Normal.  Head and Neck Head-normocephalic, atraumatic with no lesions or palpable masses. Trachea-midline. Thyroid Gland Characteristics - normal size and consistency.  Eye Eyeball - Bilateral-Extraocular movements intact. Sclera/Conjunctiva - Bilateral-No scleral icterus.  Chest and Lung Exam Chest and lung exam reveals -quiet, even and easy respiratory effort with no use of accessory muscles and on auscultation, normal breath sounds, no adventitious sounds and normal vocal resonance. Inspection Chest Wall - Normal. Back - normal.  Breast Note: There is no palpable mass in either breast. There is no palpable axillary, supraclavicular, or cervical lymphadenopathy.   Cardiovascular Cardiovascular examination reveals -normal heart sounds, regular rate and rhythm with no murmurs and normal pedal pulses bilaterally.  Abdomen Inspection Inspection of the abdomen reveals - No  Hernias. Skin - Scar - no surgical scars. Palpation/Percussion Palpation and Percussion of the abdomen reveal - Soft, Non Tender, No Rebound tenderness, No Rigidity (guarding) and No hepatosplenomegaly. Auscultation Auscultation of the abdomen reveals - Bowel  sounds normal.  Neurologic Neurologic evaluation reveals -alert and oriented x 3 with no impairment of recent or remote memory. Mental Status-Normal.  Musculoskeletal Normal Exam - Left-Upper Extremity Strength Normal and Lower Extremity Strength Normal. Normal Exam - Right-Upper Extremity Strength Normal and Lower Extremity Strength Normal.  Lymphatic Head & Neck  General Head & Neck Lymphatics: Bilateral - Description - Normal. Axillary  General Axillary Region: Bilateral - Description - Normal. Tenderness - Non Tender. Femoral & Inguinal  Generalized Femoral & Inguinal Lymphatics: Bilateral - Description - Normal. Tenderness - Non Tender.    Assessment & Plan Eddie Dibbles S. Marlou Starks MD; 04/11/2014 9:47 AM) PRIMARY CANCER OF LOWER OUTER QUADRANT OF RIGHT FEMALE BREAST (174.5  C50.511) Impression: The patient appears to have a small stage I cancer in the lower outer portion of the right breast. I have talked to her in detail about the different options for treatment including mastectomy versus breast conservation and she favors breast conservation. She is also a very good candidate for sentinel node mapping. I have discussed with her in detail the risks and benefits of the operation to do this as well as some of the technical aspects and she understands and wishes to proceed. I will plan for a right breast radioactive seed localized lumpectomy and sentinel node biopsy     Signed by Luella Cook, MD (04/11/2014 9:48 AM)

## 2014-05-17 NOTE — Interval H&P Note (Signed)
History and Physical Interval Note:  05/17/2014 6:59 AM  Bonnie Maddox  has presented today for surgery, with the diagnosis of Right Breast Cancer  The various methods of treatment have been discussed with the patient and family. After consideration of risks, benefits and other options for treatment, the patient has consented to  Procedure(s): RIGHT BREAST LUMPECTOMY WITH RADIOACTIVE SEED LOCALIZATION AND SENTINEL NODE MAPPING (Right) as a surgical intervention .  The patient's history has been reviewed, patient examined, no change in status, stable for surgery.  I have reviewed the patient's chart and labs.  Questions were answered to the patient's satisfaction.     TOTH III,Samariyah Cowles S

## 2014-05-17 NOTE — Anesthesia Preprocedure Evaluation (Signed)
Anesthesia Evaluation  Patient identified by MRN, date of birth, ID band Patient awake    Reviewed: Allergy & Precautions, H&P , NPO status , Patient's Chart, lab work & pertinent test results  Airway Mallampati: II   Neck ROM: full    Dental   Pulmonary former smoker,          Cardiovascular hypertension,     Neuro/Psych    GI/Hepatic   Endo/Other  obese  Renal/GU      Musculoskeletal  (+) Arthritis -,   Abdominal   Peds  Hematology   Anesthesia Other Findings   Reproductive/Obstetrics                             Anesthesia Physical Anesthesia Plan  ASA: II  Anesthesia Plan: General and Regional   Post-op Pain Management: MAC Combined w/ Regional for Post-op pain   Induction: Intravenous  Airway Management Planned: LMA  Additional Equipment:   Intra-op Plan:   Post-operative Plan:   Informed Consent: I have reviewed the patients History and Physical, chart, labs and discussed the procedure including the risks, benefits and alternatives for the proposed anesthesia with the patient or authorized representative who has indicated his/her understanding and acceptance.     Plan Discussed with: CRNA, Anesthesiologist and Surgeon  Anesthesia Plan Comments:         Anesthesia Quick Evaluation

## 2014-05-17 NOTE — Transfer of Care (Signed)
Immediate Anesthesia Transfer of Care Note  Patient: Bonnie Maddox  Procedure(s) Performed: Procedure(s): RIGHT BREAST LUMPECTOMY WITH RADIOACTIVE SEED LOCALIZATION AND SENTINEL NODE MAPPING (Right)  Patient Location: PACU  Anesthesia Type:GA combined with regional for post-op pain  Level of Consciousness: awake, oriented and patient cooperative  Airway & Oxygen Therapy: Patient Spontanous Breathing and Patient connected to face mask oxygen  Post-op Assessment: Report given to PACU RN and Post -op Vital signs reviewed and stable  Post vital signs: Reviewed and stable  Complications: No apparent anesthesia complications

## 2014-05-21 ENCOUNTER — Encounter (HOSPITAL_BASED_OUTPATIENT_CLINIC_OR_DEPARTMENT_OTHER): Payer: Self-pay | Admitting: General Surgery

## 2014-06-01 ENCOUNTER — Encounter: Payer: Self-pay | Admitting: Radiation Oncology

## 2014-06-01 NOTE — Progress Notes (Signed)
Location of Breast Cancer:right breast upper-outer  Histology per Pathology Report:  05/17/2014 Diagnosis 1. Lymph node, sentinel, biopsy, Right axilla - THERE IS NO EVIDENCE OF CARCINOMA IN 1 OF 1 LYMPH NODE (0/1). 2. Breast, lumpectomy, Right - INVASIVE DUCTAL CARCINOMA WITH CALCIFICATIONS, GRADE I/III, SPANNING 0.6 CM. - INVASIVE CARCINOMA IS FOCALLY LESS THAN 0.1 CM TO THE INFERIOR MARGIN. - SEE ONCOLOGY TABLE BELOW. Microscopic Comment 2. BREAST, INVASIVE TUMOR, WITH LYMPH NODES PRESENT 1 of 11 11INAL DIAGNOSIS Diagnosis 04/05/14 Breast, right, needle core biopsy, mass, 8:30 o'clock - INVASIVE DUCTAL CARCINOMA. - SEE COMMENT.  Receptor Status: ER(+), PR (+), Her2-neu (-)  Found on screening mammography.  Past/Anticipated interventions by surgeon, if [facility]:JWTGR lumpectomy  Past/Anticipated interventions by medical oncology, if any: Chemotherapy. Scheduled for consultation with Dr.Feng today at 10:45 am.  Lymphedema issues, if any: No  Pain issues, if any:No  SAFETY ISSUES:  Prior radiation? No states she had some type of light therapy for eczema  Pacemaker/ICD? No  Possible current pregnancy?No  Is the patient on methotrexate?No   Current Complaints / other details:G3P2 Menarche age 88, first pregnancy age 77, menopause in 38.No HRT Quit smoking in 1970.    Arlyss Repress, RN 06/01/2014,8:28 AM

## 2014-06-07 ENCOUNTER — Encounter: Payer: Self-pay | Admitting: Radiation Oncology

## 2014-06-07 ENCOUNTER — Ambulatory Visit (HOSPITAL_BASED_OUTPATIENT_CLINIC_OR_DEPARTMENT_OTHER): Payer: Medicare Other | Admitting: Hematology

## 2014-06-07 ENCOUNTER — Telehealth: Payer: Self-pay | Admitting: Hematology

## 2014-06-07 ENCOUNTER — Ambulatory Visit
Admission: RE | Admit: 2014-06-07 | Discharge: 2014-06-07 | Disposition: A | Discharge status: Home / Self Care | Payer: Medicare Other | Source: Ambulatory Visit | Attending: Radiation Oncology | Admitting: Radiation Oncology

## 2014-06-07 VITALS — BP 149/86 | HR 66 | Temp 97.8°F | Wt 232.7 lb

## 2014-06-07 VITALS — BP 165/70 | HR 61 | Resp 18 | Ht 70.5 in | Wt 231.0 lb

## 2014-06-07 DIAGNOSIS — C50511 Malignant neoplasm of lower-outer quadrant of right female breast: Secondary | ICD-10-CM

## 2014-06-07 DIAGNOSIS — Z87891 Personal history of nicotine dependence: Secondary | ICD-10-CM | POA: Diagnosis not present

## 2014-06-07 DIAGNOSIS — Z803 Family history of malignant neoplasm of breast: Secondary | ICD-10-CM | POA: Diagnosis not present

## 2014-06-07 DIAGNOSIS — Z170 Estrogen receptor positive status [ER+]: Secondary | ICD-10-CM | POA: Diagnosis not present

## 2014-06-07 DIAGNOSIS — Z510 Encounter for antineoplastic radiation therapy: Secondary | ICD-10-CM | POA: Diagnosis not present

## 2014-06-07 NOTE — Addendum Note (Signed)
Encounter addended by: Arlyss Repress, RN on: 06/07/2014 11:53 AM<BR>     Documentation filed: Charges VN

## 2014-06-07 NOTE — Progress Notes (Signed)
   Department of Radiation Oncology  Phone:  [phone] Fax:        [phone]   Name: Bonnie Maddox MRN: [id number]  DOB: [birth year]  Date: 06/07/2014  Follow Up Visit Note  Diagnosis: Breast cancer of lower-outer quadrant of right female breast   Staging form: Breast, AJCC 7th Edition     Clinical stage from 04/11/2014: Stage IA (T1a, N0, M0) - Unsigned  Interval History: Bonnie Maddox presents today for routine followup.  She had her lumpectomy on 12/31. This showed an 6 mm invasive cancer with a focally close inferior margin. Tumor was ER and PR positive. She has recovered well from her surgery. She has great range of motion in her arm. She meets with Dr. Burr Medico today to discuss antiestrogen treatment.   Physical Exam:  Filed Vitals:   06/07/14 0937  BP: 149/86  Pulse: 66  Temp: 97.8 F (36.6 C)  Weight: 232 lb 11.2 oz (105.552 kg)  SpO2: 100%   Pleasant female. No distress.   IMPRESSION: Bonnie Maddox is a 74 y.o. female s/p lumpectomy for an early stage breast cancer  PLAN:  I spoke to the patient today regarding her diagnosis and options for treatment. We discussed the role of radiation in decreasing local failures in patients who undergo lumpectomy. At her age, I would recommend at least one further treatment either radiation or hormones but likely not both unless she desires both. We discussed the process of simulation and the placement tattoos. We discussed 3 weeks of treatment as an outpatient. We discussed the possibility of asymptomatic lung damage. We discussed the low likelihood of secondary malignancies. We discussed the possible side effects including but not limited to skin redness, fatigue, permanent skin darkening, and breast swelling.   I have scheduled her for simulation next Wednesday. She has signed informed consent.    Thea Silversmith, MD

## 2014-06-07 NOTE — Progress Notes (Signed)
[place] NOTE  Patient Care Team: Antony Blackbird, MD as PCP - General (Family Medicine) Autumn Messing III, MD as Consulting Physician (General Surgery) Truitt Merle, MD as Consulting Physician (Hematology) Thea Silversmith, MD as Consulting Physician (Radiation Oncology) Trinda Pascal, NP as Nurse Practitioner (Nurse Practitioner)     Breast cancer of lower-outer quadrant of right female breast   04/06/2014 Initial Diagnosis Breast cancer of lower-outer quadrant of right female breast     CHIEF COMPLAINTS Follow up breast cancer   INTERIM HISTORY:  Bonnie Maddox returns for follow-up. She is doing very well overall. She denies any pain, or other symptoms. She has good energy level and remains to be physically active.  MEDICAL HISTORY:  Past Medical History  Diagnosis Date  . Breast cancer of lower-outer quadrant of right female breast 04/06/2014  . Hypertension   . Arthritis   . Wears glasses     SURGICAL HISTORY: Past Surgical History  Procedure Laterality Date  . Appendectomy 7    . Total hip arthroplasty 2001 and 2001      SOCIAL HISTORY: History   Social History  . Marital Status: maried     Spouse Name: [name]     Number of Children: 2  . Years of Education: N/A   Occupational History   Retired, Health and safety inspector    Social History Main Topics  . Smoking status: Former Smoker -- 1.00 packs/day    Types: Cigarettes    Quit date: 04/11/1969  . Smokeless tobacco: Not on file  . Alcohol Use: Yes     Comment: 21  oz/week  . Drug Use: No  . Sexual Activity: Not on file   GYN G3P2, first live birth 39, one child died in infant  OCP: 2 years  Menarche: 65 LMP: 1991 HRT: no   FAMILY HISTORY: Family History  Problem Relation Age of Onset  . Breast cancer Sister 70     ALLERGIES:  has no allergies on file.  MEDICATIONS:  She takes labetalol for hypertension, and multiple vitamin supplies including fish oil, calcium and  vitamin D.  REVIEW OF SYSTEMS:   Constitutional: Denies fevers, chills or abnormal night sweats Eyes: Denies blurriness of vision, double vision or watery eyes Ears, nose, mouth, throat, and face: Denies mucositis or sore throat Respiratory: Denies cough, dyspnea or wheezes Cardiovascular: Denies palpitation, chest discomfort or lower extremity swelling Gastrointestinal:  Denies nausea, heartburn or change in bowel habits Skin: Denies abnormal skin rashes Lymphatics: Denies new lymphadenopathy or easy bruising Neurological:Denies numbness, tingling or new weaknesses Behavioral/Psych: Mood is stable, no new changes  All other systems were reviewed with the patient and are negative.  PHYSICAL EXAMINATION: ECOG PERFORMANCE STATUS: 0 - Asymptomatic  Filed Vitals:   06/07/14 1142  BP: 165/70  Pulse: 61  Resp: 18   Filed Weights   06/07/14 1142  Weight: 231 lb (104.781 kg)    GENERAL:alert, no distress and comfortable SKIN: skin color, texture, turgor are normal, no rashes or significant lesions EYES: normal, conjunctiva are pink and non-injected, sclera clear OROPHARYNX:no exudate, no erythema and lips, buccal mucosa, and tongue normal  NECK: supple, thyroid normal size, non-tender, without nodularity LYMPH:  no palpable lymphadenopathy in the cervical, axillary or inguinal LUNGS: clear to auscultation and percussion with normal breathing effort HEART: regular rate & rhythm and no murmurs and no lower extremity edema ABDOMEN:abdomen soft, non-tender and normal bowel sounds Musculoskeletal:no cyanosis of digits and no clubbing  PSYCH: alert &  oriented x 3 with fluent speech NEURO: no focal motor/sensory deficits BREAST: Breast inspection showed them to be symmetrical with no nipple discharge. The surgical incisionS at the right breast and axillar are well-healed, no palpable mass or lymphadenopathy.  LABORATORY DATA:  I have reviewed the data as listed Lab Results  Component  Value Date   WBC 4.0 04/11/2014   HGB 13.9 05/17/2014   HCT 37.8 04/11/2014   MCV 99.2 04/11/2014   PLT 178 04/11/2014    Recent Labs  04/11/14 0827 05/16/14 1300  NA 143 139  K 4.3 4.5  CL  --  105  CO2 26 26  GLUCOSE 111 93  BUN 16.0 12  CREATININE 0.7 0.52  CALCIUM 9.5 9.2  GFRNONAA  --  >90  GFRAA  --  >90  PROT 6.9  --   ALBUMIN 4.0  --   AST 39*  --   ALT 34  --   ALKPHOS 73  --   BILITOT 0.57  --     PATHOLOGY REPORT 1. Lymph node, sentinel, biopsy, Right axilla - THERE IS NO EVIDENCE OF CARCINOMA IN 1 OF 1 LYMPH NODE (0/1). 2. Breast, lumpectomy, Right - INVASIVE DUCTAL CARCINOMA WITH CALCIFICATIONS, GRADE I/III, SPANNING 0.6 CM. - INVASIVE CARCINOMA IS FOCALLY LESS Specimen, including laterality and lymph node sampling (sentinel, non-sentinel): Right breast and right axillary sentinel node. Procedure: Seed localized lumpectomy and right axillary sentinel lymph node resection x 1. Histologic type: Ductal. Grade: I. Tubule formation: 1 Nuclear pleomorphism: 2 Mitotic: 1 Tumor size (gross measurement): 0.6 cm. Margins: Invasive, distance to closest margin: Focally less than 0.1 cm to the inferior margin (glass slide measurement). Lymphovascular invasion: Not identified. Ductal carcinoma in situ: Not identified. Lobular neoplasia: Not identified. Tumor focality: Unifocal. Treatment effect: N/A. Extent of tumor: Confined to breast parenchyma. Lymph nodes: Examined: 1 Sentinel 0 Non-sentinel 1 Total Lymph nodes with metastasis: 0. Breast prognostic profile: [phone]. Estrogen receptor: 100%, strong staining intensity. Progesterone receptor: 0%. Her 2 neu: No amplification was detected. The ratio was 1.67. Her 2 neu by [facility] will be repeated on the current case and the results reported separately. Ki-67: 4%. Non-neoplastic breast: Healing biopsy site. TNM: pT1b, pN0. (JBK:ds 05/21/14)  RADIOGRAPHIC STUDIES: I have personally reviewed the  radiological images as listed and agreed with the findings in the report.  Mm Digital Diagnostic Unilat R 04/10/2014   Ultrasound is performed, showing an irregular hypoechoic mass with indistinct and slightly spiculated margins that measures 4 x 4 x 4 mm (millimeters).     RIGHT MAMMOGRAM  ULTRASOUND RIGHT BREAST 04/05/14 IMPRESSION: 4 mm solid mass 8:30 position right breast 3 cm from the nipple. Appearances of the mass or suspicious for malignancy.  RECOMMENDATION: Ultrasound-guided biopsy is recommended and the procedure was discussed with the patient today.   BI-RADS CATEGORY  4: Suspicious.   Mm Digital Diagnostic Unilat R 04/05/2014   IMPRESSION:  Post biopsy images demonstrate the ribbon clip to be slightly superior and approximately 5 mm medial to the biopsied mass.  Final Assessment: Post Procedure Mammograms for Marker Placement       ASSESSMENT & PLAN:  Mrs. Monforte is a 74 year old female with past medical history of hypertension and osteoarthritis, but otherwise very healthy and active, who was found to have a right breast 4 mm mass at 8:30 position, biopsy confirmed grade 1 invasive ductal carcinoma.   1. Right breast invasive ductal carcinoma, T1b N0 M0, stage IA, ER 100% positive, PR negative, HER-2  negative, grade 1, Ki-67 4%  -She has had lumpectomy with negative surgical margins. She is likely cured for her early stage breast cancer  -Giving the small size of the breast cancer, G1, low Ki-67 , strong ER positivity, I do not recommend adjuvant chemotherapy.  -She will proceed adjuvant radiation soon -I recommend  adjuvant hormonal therapy to reduce the cancer recurrence. This has been improved in multiple clinical studies. She is post menopause I would recommend AI, after she completes adjuvant irradiation. She does have arthritis related pain, I recommend Aromasin.  Genetics: She has a strong family history of breast cancer (sister had breast cancer at age of 17), genetic  counseling was recommended.   Plan -proceed RT next week -RTC and start Aromasin in 5 weeks   All questions were answered. The patient knows to call the clinic with any problems, questions or concerns. I spent 20 minutes counseling the patient face to face. The total time spent in the appointment was 30 minutes and more than 50% was on counseling.     Truitt Merle, MD 06/07/2014 11:53 AM

## 2014-06-07 NOTE — Progress Notes (Signed)
Please see the Nurse Progress Note in the MD Initial Consult Encounter for this patient. 

## 2014-06-07 NOTE — Telephone Encounter (Signed)
Patient confirmed appointment.

## 2014-06-08 ENCOUNTER — Encounter: Payer: Self-pay | Admitting: Hematology

## 2014-06-14 ENCOUNTER — Ambulatory Visit
Admission: RE | Admit: 2014-06-14 | Discharge: 2014-06-14 | Disposition: A | Discharge status: Home / Self Care | Payer: Medicare Other | Source: Ambulatory Visit | Attending: Radiation Oncology | Admitting: Radiation Oncology

## 2014-06-14 DIAGNOSIS — C50511 Malignant neoplasm of lower-outer quadrant of right female breast: Secondary | ICD-10-CM | POA: Diagnosis not present

## 2014-06-14 DIAGNOSIS — Z87891 Personal history of nicotine dependence: Secondary | ICD-10-CM | POA: Diagnosis not present

## 2014-06-14 DIAGNOSIS — Z170 Estrogen receptor positive status [ER+]: Secondary | ICD-10-CM | POA: Diagnosis not present

## 2014-06-14 DIAGNOSIS — Z510 Encounter for antineoplastic radiation therapy: Secondary | ICD-10-CM | POA: Diagnosis not present

## 2014-06-14 NOTE — Progress Notes (Signed)
Name: SHERINA STAMMER   MRN: [id number]  Date:  06/14/2014  DOB: [birth year]  Status:outpatient    DIAGNOSIS: Breast cancer.  CONSENT VERIFIED: yes   SET UP: Patient is setup supine   IMMOBILIZATION:  The following immobilization was used:Custom Moldable Pillow, breast board.   NARRATIVE: Ms. Raffel was brought to the [facility].  Identity was confirmed.  All relevant records and images related to the planned course of therapy were reviewed.  Then, the patient was positioned in a stable reproducible clinical set-up for radiation therapy.  Wires were placed to delineate the clinical extent of breast tissue. A wire was placed on the scar as well.  CT images were obtained.  An isocenter was placed. Skin markings were placed.  The CT images were loaded into the planning software where the target and avoidance structures were contoured.  The radiation prescription was entered and confirmed. The patient was discharged in stable condition and tolerated simulation well.    TREATMENT PLANNING NOTE:  Treatment planning then occurred. I have requested : MLC's, isodose plan, basic dose calculation  I personally designed and supervised the construction of 3 medically necessary complex treatment devices for the protection of critical normal structures including the lungs and contralateral breast as well as the immobilization device which is necessary for set up certainty.   3D simulation occurred. I requested and analyzed a dose volume histogram of the heart, lungs and lumpectomy cavity.

## 2014-06-14 NOTE — Progress Notes (Signed)
Radiation Oncology         [phone]) [phone] ________________________________  Name: Bonnie Maddox      MRN: [id number]          Date: 06/14/2014              DOB: Apr 26, 1941  Optical Surface Tracking Plan:  Since intensity modulated radiotherapy (IMRT) and 3D conformal radiation treatment methods are predicated on accurate and precise positioning for treatment, intrafraction motion monitoring is medically necessary to ensure accurate and safe treatment delivery.  The ability to quantify intrafraction motion without excessive ionizing radiation dose can only be performed with optical surface tracking. Accordingly, surface imaging offers the opportunity to obtain 3D measurements of patient position throughout IMRT and 3D treatments without excessive radiation exposure.  I am ordering optical surface tracking for this patient's upcoming course of radiotherapy. ________________________________ Signature   Reference:   Ursula Alert, J, et al. Surface imaging-based analysis of intrafraction motion for breast radiotherapy patients.Journal of [place], n. 6, nov. 2014. ISSN [id number].   Available at: <http://www.jacmp.org/index.php/jacmp/article/view/4957>.

## 2014-06-19 DIAGNOSIS — Z87891 Personal history of nicotine dependence: Secondary | ICD-10-CM | POA: Diagnosis not present

## 2014-06-19 DIAGNOSIS — Z510 Encounter for antineoplastic radiation therapy: Secondary | ICD-10-CM | POA: Diagnosis not present

## 2014-06-19 DIAGNOSIS — C50511 Malignant neoplasm of lower-outer quadrant of right female breast: Secondary | ICD-10-CM | POA: Diagnosis not present

## 2014-06-19 DIAGNOSIS — Z170 Estrogen receptor positive status [ER+]: Secondary | ICD-10-CM | POA: Diagnosis not present

## 2014-06-20 DIAGNOSIS — Z510 Encounter for antineoplastic radiation therapy: Secondary | ICD-10-CM | POA: Diagnosis not present

## 2014-06-21 ENCOUNTER — Ambulatory Visit
Admission: RE | Admit: 2014-06-21 | Discharge: 2014-06-21 | Disposition: A | Discharge status: Home / Self Care | Payer: Medicare Other | Source: Ambulatory Visit | Attending: Radiation Oncology | Admitting: Radiation Oncology

## 2014-06-21 DIAGNOSIS — Z510 Encounter for antineoplastic radiation therapy: Secondary | ICD-10-CM | POA: Diagnosis not present

## 2014-06-21 DIAGNOSIS — Z170 Estrogen receptor positive status [ER+]: Secondary | ICD-10-CM | POA: Diagnosis not present

## 2014-06-21 DIAGNOSIS — C50511 Malignant neoplasm of lower-outer quadrant of right female breast: Secondary | ICD-10-CM | POA: Diagnosis not present

## 2014-06-21 DIAGNOSIS — Z87891 Personal history of nicotine dependence: Secondary | ICD-10-CM | POA: Diagnosis not present

## 2014-06-25 ENCOUNTER — Ambulatory Visit
Admission: RE | Admit: 2014-06-25 | Discharge: 2014-06-25 | Disposition: A | Discharge status: Home / Self Care | Payer: Medicare Other | Source: Ambulatory Visit | Attending: Radiation Oncology | Admitting: Radiation Oncology

## 2014-06-25 DIAGNOSIS — Z87891 Personal history of nicotine dependence: Secondary | ICD-10-CM | POA: Diagnosis not present

## 2014-06-25 DIAGNOSIS — Z170 Estrogen receptor positive status [ER+]: Secondary | ICD-10-CM | POA: Diagnosis not present

## 2014-06-25 DIAGNOSIS — Z510 Encounter for antineoplastic radiation therapy: Secondary | ICD-10-CM | POA: Diagnosis not present

## 2014-06-25 DIAGNOSIS — C50511 Malignant neoplasm of lower-outer quadrant of right female breast: Secondary | ICD-10-CM | POA: Diagnosis not present

## 2014-06-26 ENCOUNTER — Ambulatory Visit
Admission: RE | Admit: 2014-06-26 | Discharge: 2014-06-26 | Disposition: A | Discharge status: Home / Self Care | Payer: Medicare Other | Source: Ambulatory Visit | Attending: Radiation Oncology | Admitting: Radiation Oncology

## 2014-06-26 VITALS — BP 151/83 | HR 62 | Temp 98.1°F | Wt 232.7 lb

## 2014-06-26 DIAGNOSIS — C50511 Malignant neoplasm of lower-outer quadrant of right female breast: Secondary | ICD-10-CM | POA: Insufficient documentation

## 2014-06-26 DIAGNOSIS — Z510 Encounter for antineoplastic radiation therapy: Secondary | ICD-10-CM | POA: Insufficient documentation

## 2014-06-26 DIAGNOSIS — Z170 Estrogen receptor positive status [ER+]: Secondary | ICD-10-CM | POA: Diagnosis not present

## 2014-06-26 DIAGNOSIS — Z87891 Personal history of nicotine dependence: Secondary | ICD-10-CM | POA: Diagnosis not present

## 2014-06-26 MED ORDER — ALRA NON-METALLIC DEODORANT (RAD-ONC)
1.0000 "application " | Freq: Once | TOPICAL | Status: AC
  Administered 2014-06-26: 1 via TOPICAL

## 2014-06-26 MED ORDER — RADIAPLEXRX EX GEL
Freq: Once | CUTANEOUS | Status: AC
  Administered 2014-06-26: 12:00:00 via TOPICAL

## 2014-06-26 NOTE — Progress Notes (Signed)
Weekly Management Note Current Dose: 5.34  Gy  Projected Dose: 42.72 Gy   Narrative:  The patient presents for routine under treatment assessment.  CBCT/MVCT images/Port film x-rays were reviewed.  The chart was checked. Doing well. RN education performed.   Physical Findings: Weight: 232 lb 11.2 oz (105.552 kg). Unchanged. Alert and oriented.   Impression:  The patient is tolerating radiation.  Plan:  Continue treatment as planned. Continue radiaplex.

## 2014-06-27 ENCOUNTER — Ambulatory Visit
Admission: RE | Admit: 2014-06-27 | Discharge: 2014-06-27 | Disposition: A | Discharge status: Home / Self Care | Payer: Medicare Other | Source: Ambulatory Visit | Attending: Radiation Oncology | Admitting: Radiation Oncology

## 2014-06-27 DIAGNOSIS — Z87891 Personal history of nicotine dependence: Secondary | ICD-10-CM | POA: Diagnosis not present

## 2014-06-27 DIAGNOSIS — Z510 Encounter for antineoplastic radiation therapy: Secondary | ICD-10-CM | POA: Diagnosis not present

## 2014-06-27 DIAGNOSIS — Z170 Estrogen receptor positive status [ER+]: Secondary | ICD-10-CM | POA: Diagnosis not present

## 2014-06-27 DIAGNOSIS — C50511 Malignant neoplasm of lower-outer quadrant of right female breast: Secondary | ICD-10-CM | POA: Diagnosis not present

## 2014-06-28 ENCOUNTER — Ambulatory Visit
Admission: RE | Admit: 2014-06-28 | Discharge: 2014-06-28 | Disposition: A | Discharge status: Home / Self Care | Payer: Medicare Other | Source: Ambulatory Visit | Attending: Radiation Oncology | Admitting: Radiation Oncology

## 2014-06-28 DIAGNOSIS — Z170 Estrogen receptor positive status [ER+]: Secondary | ICD-10-CM | POA: Diagnosis not present

## 2014-06-28 DIAGNOSIS — C50511 Malignant neoplasm of lower-outer quadrant of right female breast: Secondary | ICD-10-CM | POA: Diagnosis not present

## 2014-06-28 DIAGNOSIS — Z510 Encounter for antineoplastic radiation therapy: Secondary | ICD-10-CM | POA: Diagnosis not present

## 2014-06-28 DIAGNOSIS — Z87891 Personal history of nicotine dependence: Secondary | ICD-10-CM | POA: Diagnosis not present

## 2014-06-29 ENCOUNTER — Ambulatory Visit
Admission: RE | Admit: 2014-06-29 | Discharge: 2014-06-29 | Disposition: A | Discharge status: Home / Self Care | Payer: Medicare Other | Source: Ambulatory Visit | Attending: Radiation Oncology | Admitting: Radiation Oncology

## 2014-06-29 DIAGNOSIS — Z87891 Personal history of nicotine dependence: Secondary | ICD-10-CM | POA: Diagnosis not present

## 2014-06-29 DIAGNOSIS — Z170 Estrogen receptor positive status [ER+]: Secondary | ICD-10-CM | POA: Diagnosis not present

## 2014-06-29 DIAGNOSIS — C50511 Malignant neoplasm of lower-outer quadrant of right female breast: Secondary | ICD-10-CM | POA: Diagnosis not present

## 2014-06-29 DIAGNOSIS — Z510 Encounter for antineoplastic radiation therapy: Secondary | ICD-10-CM | POA: Diagnosis not present

## 2014-07-02 ENCOUNTER — Ambulatory Visit: Payer: Medicare Other

## 2014-07-03 ENCOUNTER — Ambulatory Visit
Admission: RE | Admit: 2014-07-03 | Discharge: 2014-07-03 | Disposition: A | Discharge status: Home / Self Care | Payer: Medicare Other | Source: Ambulatory Visit | Attending: Radiation Oncology | Admitting: Radiation Oncology

## 2014-07-03 VITALS — BP 142/74 | HR 62 | Temp 97.8°F | Wt 233.2 lb

## 2014-07-03 DIAGNOSIS — Z510 Encounter for antineoplastic radiation therapy: Secondary | ICD-10-CM | POA: Diagnosis not present

## 2014-07-03 DIAGNOSIS — Z170 Estrogen receptor positive status [ER+]: Secondary | ICD-10-CM | POA: Diagnosis not present

## 2014-07-03 DIAGNOSIS — C50511 Malignant neoplasm of lower-outer quadrant of right female breast: Secondary | ICD-10-CM | POA: Diagnosis not present

## 2014-07-03 DIAGNOSIS — Z87891 Personal history of nicotine dependence: Secondary | ICD-10-CM | POA: Diagnosis not present

## 2014-07-03 NOTE — Progress Notes (Signed)
Weekly Management Note Current Dose: 16.02 Gy  Projected Dose: 42.72 Gy   Narrative:  The patient presents for routine under treatment assessment.  CBCT/MVCT images/Port film x-rays were reviewed.  The chart was checked. No complaints.   Physical Findings: Weight: 233 lb 3.2 oz (105.779 kg). Unchanged  Impression:  The patient is tolerating radiation.  Plan:  Continue treatment as planned. Continue radiaplex. Discussed port films.

## 2014-07-03 NOTE — Progress Notes (Signed)
Weekly assessment of radiation to right OrthoTraffic.ch 6 of 16 treatments.Denies pain or fatigue.Skin is pink w/o peeling.Continue application of radiaplex twice daily.

## 2014-07-04 ENCOUNTER — Ambulatory Visit
Admission: RE | Admit: 2014-07-04 | Discharge: 2014-07-04 | Disposition: A | Discharge status: Home / Self Care | Payer: Medicare Other | Source: Ambulatory Visit | Attending: Radiation Oncology | Admitting: Radiation Oncology

## 2014-07-04 DIAGNOSIS — Z87891 Personal history of nicotine dependence: Secondary | ICD-10-CM | POA: Diagnosis not present

## 2014-07-04 DIAGNOSIS — C50511 Malignant neoplasm of lower-outer quadrant of right female breast: Secondary | ICD-10-CM | POA: Diagnosis not present

## 2014-07-04 DIAGNOSIS — Z510 Encounter for antineoplastic radiation therapy: Secondary | ICD-10-CM | POA: Diagnosis not present

## 2014-07-04 DIAGNOSIS — Z170 Estrogen receptor positive status [ER+]: Secondary | ICD-10-CM | POA: Diagnosis not present

## 2014-07-05 ENCOUNTER — Ambulatory Visit
Admission: RE | Admit: 2014-07-05 | Discharge: 2014-07-05 | Disposition: A | Discharge status: Home / Self Care | Payer: Medicare Other | Source: Ambulatory Visit | Attending: Radiation Oncology | Admitting: Radiation Oncology

## 2014-07-05 DIAGNOSIS — Z510 Encounter for antineoplastic radiation therapy: Secondary | ICD-10-CM | POA: Diagnosis not present

## 2014-07-05 DIAGNOSIS — Z87891 Personal history of nicotine dependence: Secondary | ICD-10-CM | POA: Diagnosis not present

## 2014-07-05 DIAGNOSIS — Z170 Estrogen receptor positive status [ER+]: Secondary | ICD-10-CM | POA: Diagnosis not present

## 2014-07-05 DIAGNOSIS — C50511 Malignant neoplasm of lower-outer quadrant of right female breast: Secondary | ICD-10-CM | POA: Diagnosis not present

## 2014-07-06 ENCOUNTER — Ambulatory Visit
Admission: RE | Admit: 2014-07-06 | Discharge: 2014-07-06 | Disposition: A | Discharge status: Home / Self Care | Payer: Medicare Other | Source: Ambulatory Visit | Attending: Radiation Oncology | Admitting: Radiation Oncology

## 2014-07-06 DIAGNOSIS — C50511 Malignant neoplasm of lower-outer quadrant of right female breast: Secondary | ICD-10-CM

## 2014-07-06 DIAGNOSIS — Z510 Encounter for antineoplastic radiation therapy: Secondary | ICD-10-CM | POA: Diagnosis not present

## 2014-07-06 DIAGNOSIS — Z170 Estrogen receptor positive status [ER+]: Secondary | ICD-10-CM | POA: Diagnosis not present

## 2014-07-06 DIAGNOSIS — Z87891 Personal history of nicotine dependence: Secondary | ICD-10-CM | POA: Diagnosis not present

## 2014-07-06 MED ORDER — RADIAPLEXRX EX GEL
Freq: Once | CUTANEOUS | Status: AC
  Administered 2014-07-06: 10:00:00 via TOPICAL

## 2014-07-09 ENCOUNTER — Ambulatory Visit
Admission: RE | Admit: 2014-07-09 | Discharge: 2014-07-09 | Disposition: A | Discharge status: Home / Self Care | Payer: Medicare Other | Source: Ambulatory Visit | Attending: Radiation Oncology | Admitting: Radiation Oncology

## 2014-07-09 DIAGNOSIS — Z87891 Personal history of nicotine dependence: Secondary | ICD-10-CM | POA: Diagnosis not present

## 2014-07-09 DIAGNOSIS — Z510 Encounter for antineoplastic radiation therapy: Secondary | ICD-10-CM | POA: Diagnosis not present

## 2014-07-09 DIAGNOSIS — C50511 Malignant neoplasm of lower-outer quadrant of right female breast: Secondary | ICD-10-CM | POA: Diagnosis not present

## 2014-07-09 DIAGNOSIS — Z170 Estrogen receptor positive status [ER+]: Secondary | ICD-10-CM | POA: Diagnosis not present

## 2014-07-10 ENCOUNTER — Ambulatory Visit
Admission: RE | Admit: 2014-07-10 | Discharge: 2014-07-10 | Disposition: A | Discharge status: Home / Self Care | Payer: BLUE CROSS/BLUE SHIELD | Source: Ambulatory Visit | Attending: Radiation Oncology | Admitting: Radiation Oncology

## 2014-07-10 ENCOUNTER — Encounter: Payer: Self-pay | Admitting: Radiation Oncology

## 2014-07-10 ENCOUNTER — Ambulatory Visit
Admission: RE | Admit: 2014-07-10 | Discharge: 2014-07-10 | Disposition: A | Discharge status: Home / Self Care | Payer: Medicare Other | Source: Ambulatory Visit | Attending: Radiation Oncology | Admitting: Radiation Oncology

## 2014-07-10 VITALS — BP 148/67 | HR 60 | Temp 97.5°F | Resp 20 | Wt 236.1 lb

## 2014-07-10 DIAGNOSIS — C50511 Malignant neoplasm of lower-outer quadrant of right female breast: Secondary | ICD-10-CM | POA: Insufficient documentation

## 2014-07-10 DIAGNOSIS — Z510 Encounter for antineoplastic radiation therapy: Secondary | ICD-10-CM | POA: Insufficient documentation

## 2014-07-10 NOTE — Progress Notes (Addendum)
Weekly rad txs,  Right breast 11/16 completed, skin mild erythema, skin intact, using radiaplex bid, tender under axilla stated, appetite good, no nausea, tired at times 10:26 AM

## 2014-07-10 NOTE — Progress Notes (Signed)
Weekly Management Note Current Dose: 29.37  Gy  Projected Dose: 42.72 Gy   Narrative:  The patient presents for routine under treatment assessment.  CBCT/MVCT images/Port film x-rays were reviewed.  The chart was checked. Minimal skin irritation and some tenderness under axilla.. No fatigue.   Physical Findings: Weight: 236 lb 1.6 oz (107.094 kg). Slightly pink under axilla.   Impression:  The patient is tolerating radiation.  Plan:  Continue treatment as planned. Continue radiaplex.

## 2014-07-11 ENCOUNTER — Ambulatory Visit
Admission: RE | Admit: 2014-07-11 | Discharge: 2014-07-11 | Disposition: A | Discharge status: Home / Self Care | Payer: Medicare Other | Source: Ambulatory Visit | Attending: Radiation Oncology | Admitting: Radiation Oncology

## 2014-07-11 DIAGNOSIS — C50511 Malignant neoplasm of lower-outer quadrant of right female breast: Secondary | ICD-10-CM | POA: Diagnosis not present

## 2014-07-11 DIAGNOSIS — Z510 Encounter for antineoplastic radiation therapy: Secondary | ICD-10-CM | POA: Diagnosis not present

## 2014-07-12 ENCOUNTER — Ambulatory Visit
Admission: RE | Admit: 2014-07-12 | Discharge: 2014-07-12 | Disposition: A | Discharge status: Home / Self Care | Payer: Medicare Other | Source: Ambulatory Visit | Attending: Radiation Oncology | Admitting: Radiation Oncology

## 2014-07-12 ENCOUNTER — Ambulatory Visit: Payer: Medicare Other | Admitting: Hematology

## 2014-07-12 ENCOUNTER — Other Ambulatory Visit: Payer: Medicare Other

## 2014-07-12 DIAGNOSIS — Z510 Encounter for antineoplastic radiation therapy: Secondary | ICD-10-CM | POA: Diagnosis not present

## 2014-07-12 DIAGNOSIS — C50511 Malignant neoplasm of lower-outer quadrant of right female breast: Secondary | ICD-10-CM | POA: Diagnosis not present

## 2014-07-13 ENCOUNTER — Ambulatory Visit (HOSPITAL_BASED_OUTPATIENT_CLINIC_OR_DEPARTMENT_OTHER): Payer: Medicare Other | Admitting: Hematology

## 2014-07-13 ENCOUNTER — Encounter: Payer: Self-pay | Admitting: Hematology

## 2014-07-13 ENCOUNTER — Ambulatory Visit
Admission: RE | Admit: 2014-07-13 | Discharge: 2014-07-13 | Disposition: A | Discharge status: Home / Self Care | Payer: Medicare Other | Source: Ambulatory Visit | Attending: Radiation Oncology | Admitting: Radiation Oncology

## 2014-07-13 ENCOUNTER — Other Ambulatory Visit (HOSPITAL_BASED_OUTPATIENT_CLINIC_OR_DEPARTMENT_OTHER): Payer: Medicare Other

## 2014-07-13 VITALS — BP 167/80 | HR 55 | Temp 97.5°F | Resp 19 | Ht 70.0 in | Wt 236.3 lb

## 2014-07-13 DIAGNOSIS — C50511 Malignant neoplasm of lower-outer quadrant of right female breast: Secondary | ICD-10-CM

## 2014-07-13 DIAGNOSIS — Z803 Family history of malignant neoplasm of breast: Secondary | ICD-10-CM

## 2014-07-13 DIAGNOSIS — I10 Essential (primary) hypertension: Secondary | ICD-10-CM

## 2014-07-13 DIAGNOSIS — Z510 Encounter for antineoplastic radiation therapy: Secondary | ICD-10-CM | POA: Diagnosis not present

## 2014-07-13 DIAGNOSIS — M1990 Unspecified osteoarthritis, unspecified site: Secondary | ICD-10-CM

## 2014-07-13 LAB — CBC WITH DIFFERENTIAL/PLATELET
BASO%: 0.9 % (ref 0.0–2.0)
Basophils Absolute: 0 10*3/uL (ref 0.0–0.1)
EOS%: 2.7 % (ref 0.0–7.0)
Eosinophils Absolute: 0.1 10*3/uL (ref 0.0–0.5)
HCT: 37.4 % (ref 34.8–46.6)
HGB: 12.5 g/dL (ref 11.6–15.9)
LYMPH%: 34.1 % (ref 14.0–49.7)
MCH: 33.3 pg (ref 25.1–34.0)
MCHC: 33.4 g/dL (ref 31.5–36.0)
MCV: 99.8 fL (ref 79.5–101.0)
MONO#: 0.3 10*3/uL (ref 0.1–0.9)
MONO%: 9.5 % (ref 0.0–14.0)
NEUT#: 1.7 10*3/uL (ref 1.5–6.5)
NEUT%: 52.8 % (ref 38.4–76.8)
Platelets: 163 10*3/uL (ref 145–400)
RBC: 3.75 10*6/uL (ref 3.70–5.45)
RDW: 13.3 % (ref 11.2–14.5)
WBC: 3.1 10*3/uL — ABNORMAL LOW (ref 3.9–10.3)
lymph#: 1.1 10*3/uL (ref 0.9–3.3)

## 2014-07-13 LAB — COMPREHENSIVE METABOLIC PANEL (CC13)
ALT: 23 U/L (ref 0–55)
AST: 31 U/L (ref 5–34)
Albumin: 4 g/dL (ref 3.5–5.0)
Alkaline Phosphatase: 76 U/L (ref 40–150)
Anion Gap: 11 mEq/L (ref 3–11)
BUN: 16.2 mg/dL (ref 7.0–26.0)
CO2: 25 mEq/L (ref 22–29)
Calcium: 9.3 mg/dL (ref 8.4–10.4)
Chloride: 107 mEq/L (ref 98–109)
Creatinine: 0.7 mg/dL (ref 0.6–1.1)
EGFR: 86 mL/min/{1.73_m2} — ABNORMAL LOW (ref >90)
Glucose: 122 mg/dl (ref 70–140)
Potassium: 4.4 mEq/L (ref 3.5–5.1)
Sodium: 142 mEq/L (ref 136–145)
Total Bilirubin: 0.42 mg/dL (ref 0.20–1.20)
Total Protein: 6.7 g/dL (ref 6.4–8.3)

## 2014-07-13 NOTE — Progress Notes (Signed)
[place] NOTE  Patient Care Team: Antony Blackbird, MD as PCP - General (Family Medicine) Autumn Messing III, MD as Consulting Physician (General Surgery) Truitt Merle, MD as Consulting Physician (Hematology) Thea Silversmith, MD as Consulting Physician (Radiation Oncology) Trinda Pascal, NP as Nurse Practitioner (Nurse Practitioner)     Breast cancer of lower-outer quadrant of right female breast   04/06/2014 Initial Diagnosis Breast cancer of lower-outer quadrant of right female breast   05/17/2014 Definitive Surgery Right IDC with calcs, grade 1, spanning 0.6cm. Inferior margin focally <0.1cm. HER2 repeated and negative (ratio 1.43).    05/17/2014 Pathologic Stage pT1bN0, stage IA. tumor 0.6cm, inferior margin focally <0.1cm. ER 100% positive, PR negative, HER-2 negative (ratio 1.67). Grade 1, Ki-67 4%.      CHIEF COMPLAINTS Follow up breast cancer   INTERIM HISTORY:  Page returns for follow-up. She is tolerating radiation well. She is going to finish radiation by next Tuesday. She has right breast skin redness from radiation, moderate fatigue, but functions well, no other side effects from treatment.  MEDICAL HISTORY:  Past Medical History  Diagnosis Date  . Breast cancer of lower-outer quadrant of right female breast 04/06/2014  . Hypertension   . Arthritis   . Wears glasses     SURGICAL HISTORY: Past Surgical History  Procedure Laterality Date  . Appendectomy 8    . Total hip arthroplasty 2001 and 2001      SOCIAL HISTORY: History   Social History  . Marital Status: maried     Spouse Name: [name]     Number of Children: 2  . Years of Education: N/A   Occupational History   Retired, Health and safety inspector    Social History Main Topics  . Smoking status: Former Smoker -- 1.00 packs/day    Types: Cigarettes    Quit date: 04/11/1969  . Smokeless tobacco: Not on file  . Alcohol Use: Yes     Comment: 21  oz/week  . Drug Use: No  .  Sexual Activity: Not on file   GYN G3P2, first live birth 20, one child died in infant  OCP: 2 years  Menarche: 88 LMP: 1991 HRT: no   FAMILY HISTORY: Family History  Problem Relation Age of Onset  . Breast cancer Sister 14     ALLERGIES:  has No Known Allergies.  MEDICATIONS:  She takes labetalol for hypertension, and multiple vitamin supplies including fish oil, calcium and vitamin D.  REVIEW OF SYSTEMS:   Constitutional: Denies fevers, chills or abnormal night sweats Eyes: Denies blurriness of vision, double vision or watery eyes Ears, nose, mouth, throat, and face: Denies mucositis or sore throat Respiratory: Denies cough, dyspnea or wheezes Cardiovascular: Denies palpitation, chest discomfort or lower extremity swelling Gastrointestinal:  Denies nausea, heartburn or change in bowel habits Skin: Denies abnormal skin rashes Lymphatics: Denies new lymphadenopathy or easy bruising Neurological:Denies numbness, tingling or new weaknesses Behavioral/Psych: Mood is stable, no new changes  All other systems were reviewed with the patient and are negative.  PHYSICAL EXAMINATION: ECOG PERFORMANCE STATUS: 0 - Asymptomatic  Filed Vitals:   07/13/14 1028  BP: 181/84  Pulse: 59  Temp: 97.5 F (36.4 C)  Resp: 19   Filed Weights   07/13/14 1028  Weight: 236 lb 4.8 oz (107.185 kg)    GENERAL:alert, no distress and comfortable SKIN: skin color, texture, turgor are normal, no rashes or significant lesions EYES: normal, conjunctiva are pink and non-injected, sclera clear OROPHARYNX:no exudate, no erythema  and lips, buccal mucosa, and tongue normal  NECK: supple, thyroid normal size, non-tender, without nodularity LYMPH:  no palpable lymphadenopathy in the cervical, axillary or inguinal LUNGS: clear to auscultation and percussion with normal breathing effort HEART: regular rate & rhythm and no murmurs and no lower extremity edema ABDOMEN:abdomen soft, non-tender and normal  bowel sounds Musculoskeletal:no cyanosis of digits and no clubbing  PSYCH: alert & oriented x 3 with fluent speech NEURO: no focal motor/sensory deficits BREAST: Breast inspection showed them to be symmetrical with no nipple discharge. The surgical incisionS at the right breast and axillar are well-healed, no palpable mass or lymphadenopathy.  LABORATORY DATA:  I have reviewed the data as listed Lab Results  Component Value Date   WBC 3.1* 07/13/2014   HGB 12.5 07/13/2014   HCT 37.4 07/13/2014   MCV 99.8 07/13/2014   PLT 163 07/13/2014    Recent Labs  04/11/14 0827 05/16/14 1300 07/13/14 0921  NA 143 139 142  K 4.3 4.5 4.4  CL  --  105  --   CO2 26 26 25  GLUCOSE 111 93 122  BUN 16.0 12 16.2  CREATININE 0.7 0.52 0.7  CALCIUM 9.5 9.2 9.3  GFRNONAA  --  >90  --   GFRAA  --  >90  --   PROT 6.9  --  6.7  ALBUMIN 4.0  --  4.0  AST 39*  --  31  ALT 34  --  23  ALKPHOS 73  --  76  BILITOT 0.57  --  0.42    PATHOLOGY REPORT 1. Lymph node, sentinel, biopsy, Right axilla - THERE IS NO EVIDENCE OF CARCINOMA IN 1 OF 1 LYMPH NODE (0/1). 2. Breast, lumpectomy, Right - INVASIVE DUCTAL CARCINOMA WITH CALCIFICATIONS, GRADE I/III, SPANNING 0.6 CM. - INVASIVE CARCINOMA IS FOCALLY LESS Specimen, including laterality and lymph node sampling (sentinel, non-sentinel): Right breast and right axillary sentinel node. Procedure: Seed localized lumpectomy and right axillary sentinel lymph node resection x 1. Histologic type: Ductal. Grade: I. Tubule formation: 1 Nuclear pleomorphism: 2 Mitotic: 1 Tumor size (gross measurement): 0.6 cm. Margins: Invasive, distance to closest margin: Focally less than 0.1 cm to the inferior margin (glass slide measurement). Lymphovascular invasion: Not identified. Ductal carcinoma in situ: Not identified. Lobular neoplasia: Not identified. Tumor focality: Unifocal. Treatment effect: N/A. Extent of tumor: Confined to breast parenchyma. Lymph  nodes: Examined: 1 Sentinel 0 Non-sentinel 1 Total Lymph nodes with metastasis: 0. Breast prognostic profile: [id number]. Estrogen receptor: 100%, strong staining intensity. Progesterone receptor: 0%. Her 2 neu: No amplification was detected. The ratio was 1.67. Her 2 neu by [facility] will be repeated on the current case and the results reported separately. Ki-67: 4%. Non-neoplastic breast: Healing biopsy site. TNM: pT1b, pN0. (JBK:ds 05/21/14)  RADIOGRAPHIC STUDIES: I have personally reviewed the radiological images as listed and agreed with the findings in the report.  Mm Digital Diagnostic Unilat R 04/10/2014   Ultrasound is performed, showing an irregular hypoechoic mass with indistinct and slightly spiculated margins that measures 4 x 4 x 4 mm (millimeters).     RIGHT MAMMOGRAM  ULTRASOUND RIGHT BREAST 04/05/14 IMPRESSION: 4 mm solid mass 8:30 position right breast 3 cm from the nipple. Appearances of the mass or suspicious for malignancy.  RECOMMENDATION: Ultrasound-guided biopsy is recommended and the procedure was discussed with the patient today.   BI-RADS CATEGORY  4: Suspicious.   Mm Digital Diagnostic Unilat R 04/05/2014   IMPRESSION:  Post biopsy images demonstrate the ribbon clip   to be slightly superior and approximately 5 mm medial to the biopsied mass.  Final Assessment: Post Procedure Mammograms for Marker Placement       ASSESSMENT & PLAN:  Mrs. Bonnie Maddox is a 74 year old female with past medical history of hypertension and osteoarthritis, but otherwise very healthy and active, who was found to have a right breast 4 mm mass at 8:30 position, biopsy confirmed grade 1 invasive ductal carcinoma.   1. Right breast invasive ductal carcinoma, T1b N0 M0, stage IA, ER 100% positive, PR negative, HER-2 negative, grade 1, Ki-67 4%  -She has had lumpectomy with negative surgical margins. She is likely cured for her early stage breast cancer  -Giving the small size of the breast  cancer, G1, low Ki-67 , strong ER positivity, I do not recommend adjuvant chemotherapy.  -She is going to finish radiation soon. -I recommend  Adjuvant endocrine therapy with aromatase inhibitor to reduce the cancer recurrence.  The benefit of the endocrine therapy has been demonstrated in multiple clinical studies. I explained the potential side effects of aromatase inhibitor to patient. After a lengthy discussion, patient still declines it, due to the concern of side effects.  -She voiced good understanding of cancer recurrence risks.  -I offered her appointment at our survivorship clinic, but she does not want to follow-up with me or our survivorship clinic. She agrees to have annual screening mammogram and follow-up with her primary care physician.  Genetics: She has a strong family history of breast cancer (sister had breast cancer at age of 89), genetic counseling was recommended, but she declined.  Plan -I will see you as needed in the future -please do annual mammogram and follow up with your PCP   All questions were answered. The patient knows to call the clinic with any problems, questions or concerns. I spent 20 minutes counseling the patient face to face. The total time spent in the appointment was 25 minutes and more than 50% was on counseling.     Truitt Merle, MD 07/13/2014 10:51 AM

## 2014-07-14 ENCOUNTER — Encounter: Payer: Self-pay | Admitting: Hematology

## 2014-07-16 ENCOUNTER — Encounter: Payer: Self-pay | Admitting: Adult Health

## 2014-07-16 ENCOUNTER — Ambulatory Visit: Payer: Medicare Other

## 2014-07-16 ENCOUNTER — Ambulatory Visit
Admission: RE | Admit: 2014-07-16 | Discharge: 2014-07-16 | Disposition: A | Discharge status: Home / Self Care | Payer: Medicare Other | Source: Ambulatory Visit | Attending: Radiation Oncology | Admitting: Radiation Oncology

## 2014-07-16 DIAGNOSIS — C50511 Malignant neoplasm of lower-outer quadrant of right female breast: Secondary | ICD-10-CM | POA: Diagnosis not present

## 2014-07-16 DIAGNOSIS — Z510 Encounter for antineoplastic radiation therapy: Secondary | ICD-10-CM | POA: Diagnosis not present

## 2014-07-16 NOTE — Progress Notes (Signed)
I met with Ms. Philbin briefly today before her radiation therapy treatment today.  She is scheduled to complete treatment on 07/17/14.  I gave her the "Life After Cancer for Every Survivor" survivorship pamphlet, along with her follow-up appointments.   She will see Dr. Pablo Ledger for her 72-monthRadiation Oncology follow-up on: 08/16/14 at 8:30 am.  She will then see me in the Survivorship Clinic on: 08/16/14 at 9 am.   Ms. CStachnikwas given a card with these appointments written on it and she expressed verbal understanding.  She was encouraged to call with any questions/concerns she may have before her next appointment here.   GMike Craze NP [phone]

## 2014-07-17 ENCOUNTER — Ambulatory Visit
Admission: RE | Admit: 2014-07-17 | Discharge: 2014-07-17 | Disposition: A | Discharge status: Home / Self Care | Payer: Medicare Other | Source: Ambulatory Visit | Attending: Radiation Oncology | Admitting: Radiation Oncology

## 2014-07-17 ENCOUNTER — Encounter: Payer: Self-pay | Admitting: Radiation Oncology

## 2014-07-17 ENCOUNTER — Ambulatory Visit: Payer: Medicare Other

## 2014-07-17 ENCOUNTER — Ambulatory Visit
Admission: RE | Admit: 2014-07-17 | Discharge: 2014-07-17 | Disposition: A | Discharge status: Home / Self Care | Payer: BLUE CROSS/BLUE SHIELD | Source: Ambulatory Visit | Attending: Radiation Oncology | Admitting: Radiation Oncology

## 2014-07-17 VITALS — BP 138/78 | HR 60 | Temp 97.6°F | Wt 234.8 lb

## 2014-07-17 DIAGNOSIS — C50511 Malignant neoplasm of lower-outer quadrant of right female breast: Secondary | ICD-10-CM

## 2014-07-17 DIAGNOSIS — Z510 Encounter for antineoplastic radiation therapy: Secondary | ICD-10-CM | POA: Diagnosis not present

## 2014-07-17 NOTE — Progress Notes (Signed)
  Radiation Oncology         [phone]) [phone] ________________________________  Name: Bonnie Maddox MRN: [id number]  Date: 07/17/2014  DOB: April 23, 1941  End of Treatment Note  Diagnosis:   Stage I Right breast Cancer   Indication for treatment:  Curative       Radiation treatment dates:   06/25/14-3/16  Site/dose:   Right breast/ 42.72 Gy at 2.67 Gy per fraction x 21 fractions.   Beams/energy:  Opposed tangents with reduced fields / 6 MV photons  Narrative: The patient tolerated radiation treatment relatively well.   She had minimal skin irritation which was treated with radiaplex.  Plan: The patient has completed radiation treatment. The patient will return to radiation oncology clinic for routine followup in one month. I advised them to call or return sooner if they have any questions or concerns related to their recovery or treatment.  ------------------------------------------------  Thea Silversmith, MD

## 2014-07-17 NOTE — Progress Notes (Signed)
Patient completes 16 of 16 treatments to right breast.Mild redness.Continue application of radiaplex 2 to 3 times daily for next 2 to 3 weeks then apply lotion with vitamin e.Denies pain or fatigue.

## 2014-07-18 ENCOUNTER — Ambulatory Visit: Payer: Medicare Other

## 2014-08-15 NOTE — Progress Notes (Signed)
CLINIC:  Cancer Survivorship   REASON FOR VISIT:  Routine follow-up post-treatment for a recent history of breast cancer.  BRIEF ONCOLOGIC HISTORY:    Breast cancer of lower-outer quadrant of right female breast   03/26/2014 Breast US Recalled from screening mammogram for right breast mass.  Diagnostic mammogram and ultrasound revealed 4 mm solid mass, 3 cm from the nipple. Right axilla appeared normal without lymphadenopathy.    04/05/2014 Initial Biopsy Right breast needle core biopsy at 8:30 o'clock revealed grade 1, IDC. ER+ (100%), PR neg., HER2 neg. (ratio 1.67). Ki67: 4%.   04/06/2014 Initial Diagnosis Breast cancer of lower-outer quadrant of right female breast   05/17/2014 Definitive Surgery Right lumpectomy with SLNB revealed grade 1 IDC with calcs, spanning 0.6cm. 1 SLN removed was negative. Invasive carcinoma  focally <0.1cm at inferior margin. HER2 repeated and negative (ratio 1.43).    05/17/2014 Pathologic Stage pT1bN0, Stage IA   06/25/2014 - 07/17/2014 Radiation Therapy RT to right breast. Total dose: 42.72 Gy over 21 fractions.     Anti-estrogen oral therapy Pt has declined anti-estrogen therapy d/t her concerns regarding potential side effects.      Survivorship Bonnie Maddox has decided to have her follow-up care for the breast cancer with her PCP. She should receive annual mammogram and close follow-up with her PCP per her wishes.    INTERVAL HISTORY:  Bonnie Maddox presents to the [facility] today for our initial meeting to review her survivorship care plan detailing her treatment course for breast cancer, as well as monitoring long-term side effects of that treatment, education regarding health maintenance, screening, and overall wellness and health promotion.     Overall, Bonnie Maddox reports feeling quite well since completing her radiation therapy approximately one month ago.  She endorses occasional fatigue that was worse during radiation therapy, but has steadily been  improving over the past month since completing RT.  The fatigue does not interfere with her ability to complete ADLs or to enjoy going the things she enjoys doing.  She is excited about being finished with treatment for her breast cancer and is looking forward to getting back to gardening, one of her favorite hobbies.  She reports having an excellent support system in her family and friends and is coping quite well.    REVIEW OF SYSTEMS:  General: Denies fever, chills, unintentional weight loss. Endorses fatigue, as stated above.  Cardiac: Denies palpitations, chest pain. Respiratory: Denies cough, shortness of breath, and dyspnea on exertion.  GI: Denies abdominal pain, constipation, diarrhea, nausea, or vomiting.  GU: Denies dysuria, hematuria, vaginal bleeding, vaginal discharge, or vaginal dryness.  Musculoskeletal: Endorses bilateral hip and knee pain (chronic issue).   Neuro: Denies headache or recent falls. Denies peripheral neuropathy. Skin: Denies rash, pruritis, or open wounds.  Extremities: Endorses bilateral lower extremity edema (chronic issue).  Breast: Denies any new nodularity, masses, tenderness, nipple changes, or nipple discharge.  Psych: Denies depression, anxiety, insomnia, or memory loss.   A 14-point review of systems was completed and was negative, except as noted above.  BRIEF ONCOLOGIC HISTORY:    Breast cancer of lower-outer quadrant of right female breast   03/26/2014 Breast US Recalled from screening mammogram for right breast mass.  Diagnostic mammogram and ultrasound revealed 4 mm solid mass, 3 cm from the nipple. Right axilla appeared normal without lymphadenopathy.    04/05/2014 Initial Biopsy Right breast needle core biopsy at 8:30 o'clock revealed grade 1, IDC. ER+ (100%), PR neg., HER2 neg. (ratio  1.67). Ki67: 4%.   04/06/2014 Initial Diagnosis Breast cancer of lower-outer quadrant of right female breast   05/17/2014 Definitive Surgery Right lumpectomy with  SLNB revealed grade 1 IDC with calcs, spanning 0.6cm. 1 SLN removed was negative. Invasive carcinoma  focally <0.1cm at inferior margin. HER2 repeated and negative (ratio 1.43).    05/17/2014 Pathologic Stage pT1bN0, Stage IA   06/25/2014 - 07/17/2014 Radiation Therapy RT to right breast. Total dose: 42.72 Gy over 21 fractions.     Anti-estrogen oral therapy Pt has declined anti-estrogen therapy d/t her concerns regarding potential side effects.      Survivorship Bonnie Maddox has decided to have her follow-up care for the breast cancer with her PCP. She should receive annual mammogram and close follow-up with her PCP per her wishes.     ONCOLOGY TREATMENT TEAM:  1. Surgeon:  Dr. Marlou Starks at [facility] 2. Medical Oncologist: Dr. Burr Medico 3. Radiation Oncologist: Dr. Pablo Ledger    PAST MEDICAL/SURGICAL HISTORY:  Past Medical History  Diagnosis Date  . Breast cancer of lower-outer quadrant of right female breast 04/06/2014  . Hypertension   . Arthritis   . Wears glasses    Past Surgical History  Procedure Laterality Date  . Appendectomy    . Total hip arthroplasty  2001,2002    left and right  . Tubal ligation    . Colonoscopy    . Breast lumpectomy with needle localization and axillary sentinel lymph node bx Right 05/17/2014    Procedure: RIGHT BREAST LUMPECTOMY WITH RADIOACTIVE SEED LOCALIZATION AND SENTINEL NODE MAPPING;  Surgeon: Autumn Messing III, MD;  Location: [facility];  Service: General;  Laterality: Right;     ALLERGIES:  No Known Allergies   CURRENT MEDICATIONS:  Outpatient Encounter Prescriptions as of 08/16/2014  Medication Sig  . atenolol (TENORMIN) 25 MG tablet Take by mouth every evening.  . non-metallic deodorant Jethro Poling) MISC Apply 1 application topically daily as needed.  . [DISCONTINUED] Wound Dressings (RADIAGEL EX) Apply topically.      ONCOLOGIC FAMILY HISTORY:  1. Sister: Breast cancer  Note: Genetic counseling was recommended for Ms.  Maddox at diagnosis, but she declined.    SOCIAL HISTORY:  Bonnie Maddox is married and lives with her husband in [place], [place].  She has 2 children, one son who lives in [place], and one daughter who lives in [place]. 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  She was born and raised in [place] and has lived in [place] for about 5+ years now.  She has a history of cigarette smoking, but quit in the 1970s. She denies any current or history of  alcohol or illicit drug use.  PHYSICAL EXAMINATION:  Vital Signs: Filed Vitals:   08/16/14 0841  BP: 138/75  Pulse: 64  Temp: 97.6 F (36.4 C)  Resp: 16   General: Well-nourished, well-appearing female in no acute distress.  She is unaccompanied in clinic today.   HEENT: Head is atraumatic and normocephalic.  Pupils equal and reactive to light and accomodation. Conjunctivae clear without exudate.  Sclerae anicteric. Oral mucosa is pink, moist, and intact without lesions.  Oropharynx is pink without lesions or erythema.  Lymph: No cervical, supraclavicular, infraclavicular, or axillary lymphadenopathy noted on palpation.  Cardiovascular: Regular rate and rhythm without murmurs, rubs, or gallops. Respiratory: Clear to auscultation bilaterally. Chest expansion symmetric without accessory muscle use on inspiration or expiration.  GI: Abdomen soft and round. No tenderness  to palpation. Bowel sounds normoactive in 4 quadrants. No hepatosplenomegaly.  Small right inguinal hernia felt (patient states this is a chronic finding for her).  GU: Deferred.  Musculoskeletal: Muscle strength 5/5 in all extremities.  Full ROM noted in all extremities.  Neuro: No focal deficits. Steady gait.  Psych: Mood and affect normal and appropriate for situation.  Extremities: No cyanosis, or clubbing. 1-2+ bilateral lower extremity/pedal edema.  Skin: Warm and dry. No open lesions noted.   LABORATORY DATA:  None  for this visit.  DIAGNOSTIC IMAGING:  None for this visit.     ASSESSMENT AND PLAN:   1. History of breast cancer: Per the patient's wishes,  Bonnie Maddox will receive her subsequent oncologic follow-up with her surgeon, Dr. Marlou Starks.  She will see Dr. Marlou Starks every 4-6 months for history and physical exam per surveillance protocol. She will continue to need mammography annually as well, which will be arranged by her PCP or Dr. Marlou Starks.  A comprehensive survivorship care plan and treatment summary was reviewed with the patient today detailing her breast cancer diagnosis, treatment course, potential late/long-term effects of treatment, appropriate follow-up care with recommendations for the future, and patient education resources.  A copy of this summary, along with a letter will be sent to the patient's primary care provider, will be mailed/routed after today's visit.  Bonnie Maddox is welcome to return to the Survivorship Clinic in the future, as needed; no follow-up will be scheduled at this time.    2. Fatigue: This is likely multifactorial and is likely related to her recent completion of radiation therapy.  It is not uncommon for patients to continue to experience fatigue upon completing anti-cancer treatments.  I encouraged Bonnie Maddox to participate in our [facility], designed to help cancer survivors engage in a structured physical activity program after completing cancer treatments.  She is going to think about joining this program and I gave her the necessary contact information to get enrolled if she chooses to do so.  Otherwise, I encouraged her to engage in regular physical activity, which is the most successful strategy to combat fatigue.   3. Bone health:  Given Bonnie Maddox's age and history of breast cancer, she is at risk for bone demineralization. She should receive a DEXA scan at the recommendation of her PCP, as clinically indicated.  In the meantime, she was encouraged to increase her  consumption of foods rich in calcium, as well as increase her weight-bearing activities.  She was given education on specific activities to promote bone health.  4. Cancer screening:  Due to Bonnie Maddox's history and her age, she should receive screening for skin cancers, colon cancer, and gynecologic cancers.  The information and recommendations are listed on the patient's comprehensive care plan/treatment summary and were reviewed in detail with the patient.    5. Health maintenance and wellness promotion: Bonnie Maddox was encouraged to consume 5-7 servings of fruits and vegetables per day. She was also encouraged to engage in moderate to vigorous exercise for 30 minutes per day most days of the week. She was instructed to limit her alcohol consumption and continue to abstain from tobacco use.    6. Support services/counseling: It is not uncommon for this period of the patient's cancer care trajectory to be one of many emotions and stressors.  Bonnie Maddox was encouraged to take advantage of our many support services programs, support groups, and/or counseling in coping with her new life as a cancer survivor after  completing anti-cancer treatment.  She was offered support today through active listening and expressive supportive counseling.  She was given information regarding our available services and encouraged to contact me with any questions or for help enrolling in any of our support group/programs.    A total of 30 minutes of face-to-face time was spent with this patient with greater than 50% of that time in counseling and care-coordination.   Mike Craze, NP Survivorship Program [facility] [phone]   Note: PRIMARY CARE PROVIDER Antony Blackbird, [place] [phone]

## 2014-08-16 ENCOUNTER — Encounter: Payer: Self-pay | Admitting: Adult Health

## 2014-08-16 ENCOUNTER — Ambulatory Visit
Admission: RE | Admit: 2014-08-16 | Discharge: 2014-08-16 | Disposition: A | Discharge status: Home / Self Care | Payer: Medicare Other | Source: Ambulatory Visit | Attending: Radiation Oncology | Admitting: Radiation Oncology

## 2014-08-16 ENCOUNTER — Ambulatory Visit (HOSPITAL_BASED_OUTPATIENT_CLINIC_OR_DEPARTMENT_OTHER): Payer: Medicare Other | Admitting: Adult Health

## 2014-08-16 VITALS — BP 138/75 | HR 64 | Temp 97.6°F | Resp 16

## 2014-08-16 VITALS — BP 138/75 | HR 64 | Temp 97.6°F | Wt 235.4 lb

## 2014-08-16 DIAGNOSIS — C50511 Malignant neoplasm of lower-outer quadrant of right female breast: Secondary | ICD-10-CM

## 2014-08-16 DIAGNOSIS — R5383 Other fatigue: Secondary | ICD-10-CM | POA: Diagnosis not present

## 2014-08-16 DIAGNOSIS — Z853 Personal history of malignant neoplasm of breast: Secondary | ICD-10-CM

## 2014-08-16 NOTE — Progress Notes (Signed)
   Department of Radiation Oncology  Phone:  [phone] Fax:        [phone]   Name: Bonnie Maddox MRN: [id number]  DOB: 1940/12/27  Date: 08/16/2014  Follow Up Visit Note  Diagnosis: Breast cancer of lower-outer quadrant of right female breast   Staging form: Breast, AJCC 7th Edition     Clinical stage from 04/11/2014: Stage IA (T1a, N0, M0) - Unsigned     Pathologic stage from 05/17/2014: Stage IA (T1b, N0, cM0) - Unsigned  Summary and Interval since last radiation: 1 month from 42.72 to the right breast completed 07/2014  Interval History: Bonnie Maddox presents today for routine followup.  She declined anti-estrogen treatment. She has been released from follow up by Dr. Burr Medico at her request. She has follow up scheduled with Dr. Marlou Starks in June and will be following up with him on a regular basis.   Physical Exam:  Filed Vitals:   08/16/14 0840  BP: 138/75  Pulse: 64  Temp: 97.6 F (36.4 C)  Weight: 235 lb 6.4 oz (106.777 kg)   Some hyperpigmentation of the right breast. Skin is well healed. Alert and oriented.   IMPRESSION: Bonnie Maddox is a 74 y.o. female s/p breast conservation with resolving acute effects of treatment.   PLAN:  She is doing well. We discussed the need for follow up every 4-6 months which she has scheduled.  We discussed the need for yearly mammograms which she can schedule with Dr. Marlou Starks. We discussed the need for sun protection in the treated area.  She can always call me with questions.  I will follow up with her on an as needed basis.   She is meeting with our survivorship navigator following this.    Thea Silversmith, MD

## 2014-09-11 ENCOUNTER — Encounter: Payer: Self-pay | Admitting: Adult Health

## 2014-09-11 NOTE — Progress Notes (Signed)
A birthday card was mailed to the patient today on behalf of the Survivorship Program at [facility].   Avyay Coger, NP Survivorship Program [facility] [phone]  

## 2014-10-30 DIAGNOSIS — C50511 Malignant neoplasm of lower-outer quadrant of right female breast: Secondary | ICD-10-CM | POA: Diagnosis not present

## 2014-12-12 DIAGNOSIS — Z853 Personal history of malignant neoplasm of breast: Secondary | ICD-10-CM | POA: Diagnosis not present

## 2014-12-12 DIAGNOSIS — Z79899 Other long term (current) drug therapy: Secondary | ICD-10-CM | POA: Diagnosis not present

## 2014-12-12 DIAGNOSIS — I10 Essential (primary) hypertension: Secondary | ICD-10-CM | POA: Diagnosis not present

## 2014-12-12 DIAGNOSIS — R739 Hyperglycemia, unspecified: Secondary | ICD-10-CM | POA: Diagnosis not present

## 2014-12-12 DIAGNOSIS — E785 Hyperlipidemia, unspecified: Secondary | ICD-10-CM | POA: Diagnosis not present

## 2015-02-18 ENCOUNTER — Other Ambulatory Visit: Payer: Self-pay | Admitting: General Surgery

## 2015-02-18 ENCOUNTER — Other Ambulatory Visit: Payer: Self-pay | Admitting: Hematology

## 2015-02-18 DIAGNOSIS — Z853 Personal history of malignant neoplasm of breast: Secondary | ICD-10-CM

## 2015-02-18 DIAGNOSIS — Z9889 Other specified postprocedural states: Secondary | ICD-10-CM

## 2015-03-08 ENCOUNTER — Ambulatory Visit
Admission: RE | Admit: 2015-03-08 | Discharge: 2015-03-08 | Disposition: A | Discharge status: Home / Self Care | Payer: Medicare Other | Source: Ambulatory Visit | Attending: General Surgery | Admitting: General Surgery

## 2015-03-08 DIAGNOSIS — Z9889 Other specified postprocedural states: Secondary | ICD-10-CM

## 2015-03-08 DIAGNOSIS — Z853 Personal history of malignant neoplasm of breast: Secondary | ICD-10-CM

## 2015-03-08 DIAGNOSIS — R928 Other abnormal and inconclusive findings on diagnostic imaging of breast: Secondary | ICD-10-CM | POA: Diagnosis not present

## 2015-03-28 DIAGNOSIS — Z23 Encounter for immunization: Secondary | ICD-10-CM | POA: Diagnosis not present

## 2015-04-29 IMAGING — MG MM DIGITAL DIAGNOSTIC UNILAT*R*
2 series · 2 of 2 positions shown · non-contrast
Comparison: 03/06/2014 and earlier priors

ACR Breast Density Category a: The breast tissue is almost entirely
fatty.

ADDENDUM:
This report is created to correct a typographical error in the
"Findings" section of the report. The correct size of the mass seen
on ultrasound should be as follows:

Ultrasound is performed, showing an irregular hypoechoic mass with
indistinct and slightly spiculated margins that measures 4 x 4 x 4
mm (millimeters).
CLINICAL DATA: Possible mass right breast identified on recent
screening mammogram.
EXAM:
DIGITAL DIAGNOSTIC  RIGHT MAMMOGRAM
ULTRASOUND RIGHT BREAST

[R MLO]
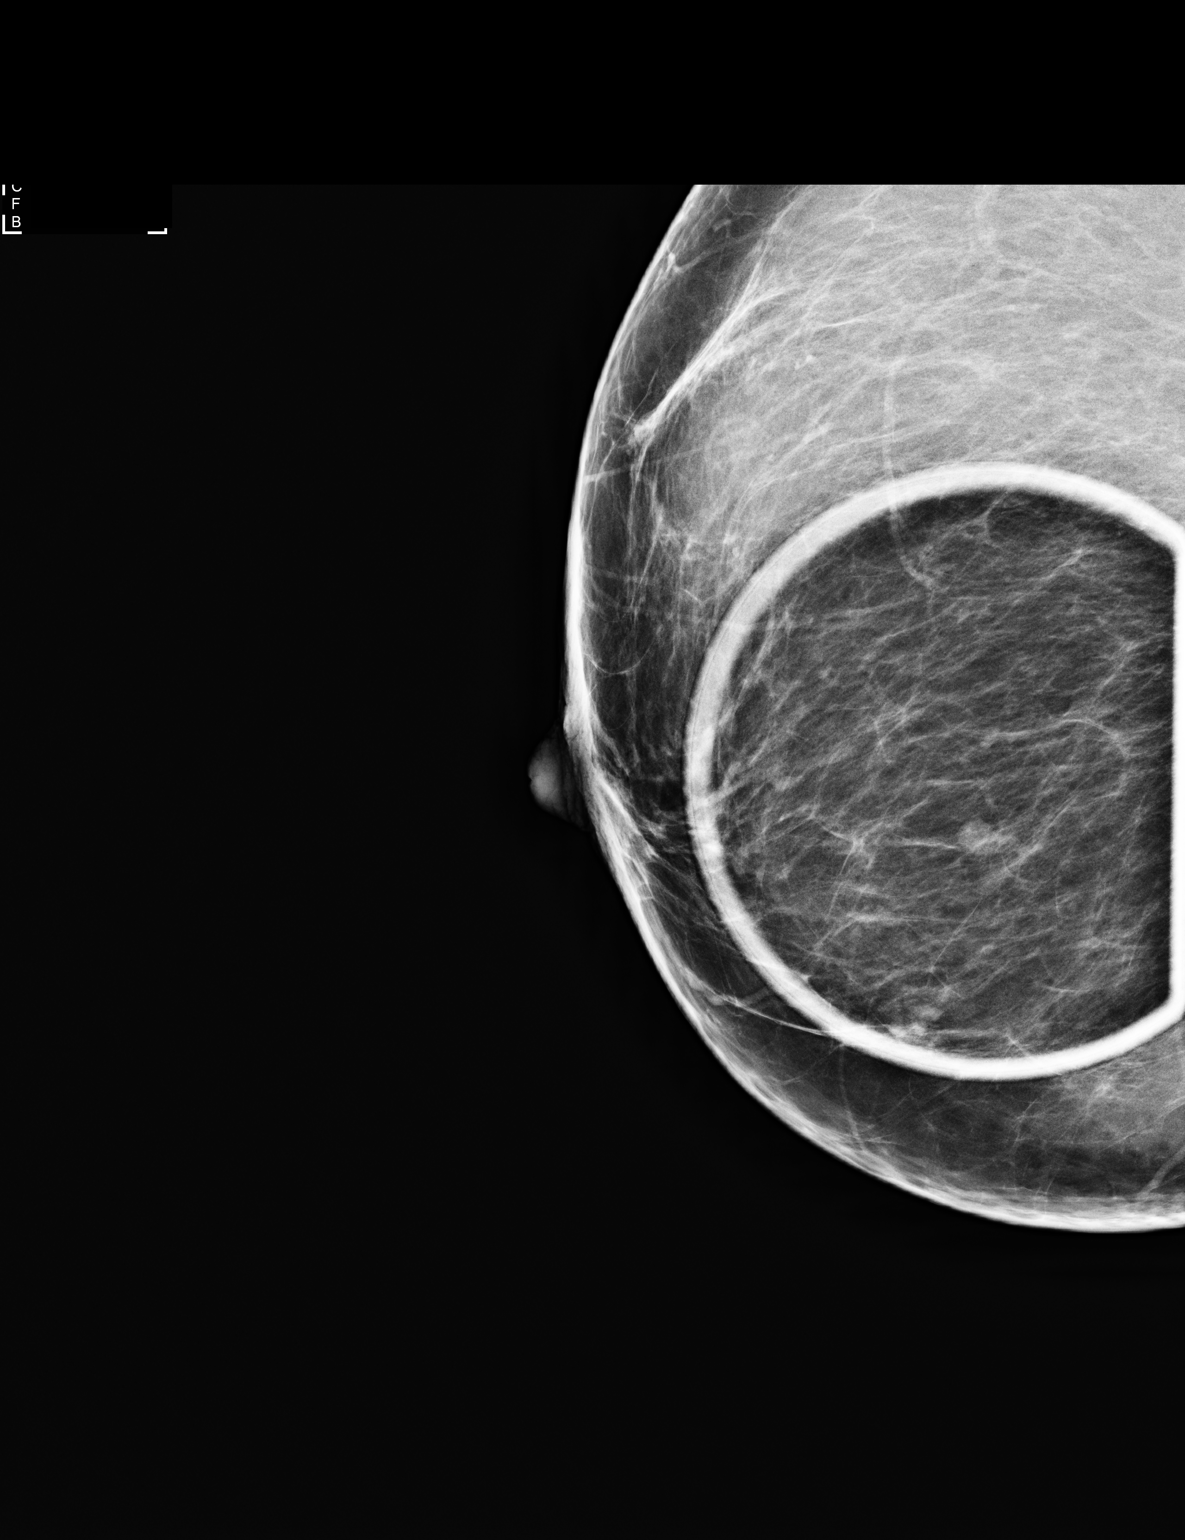

[R CC]
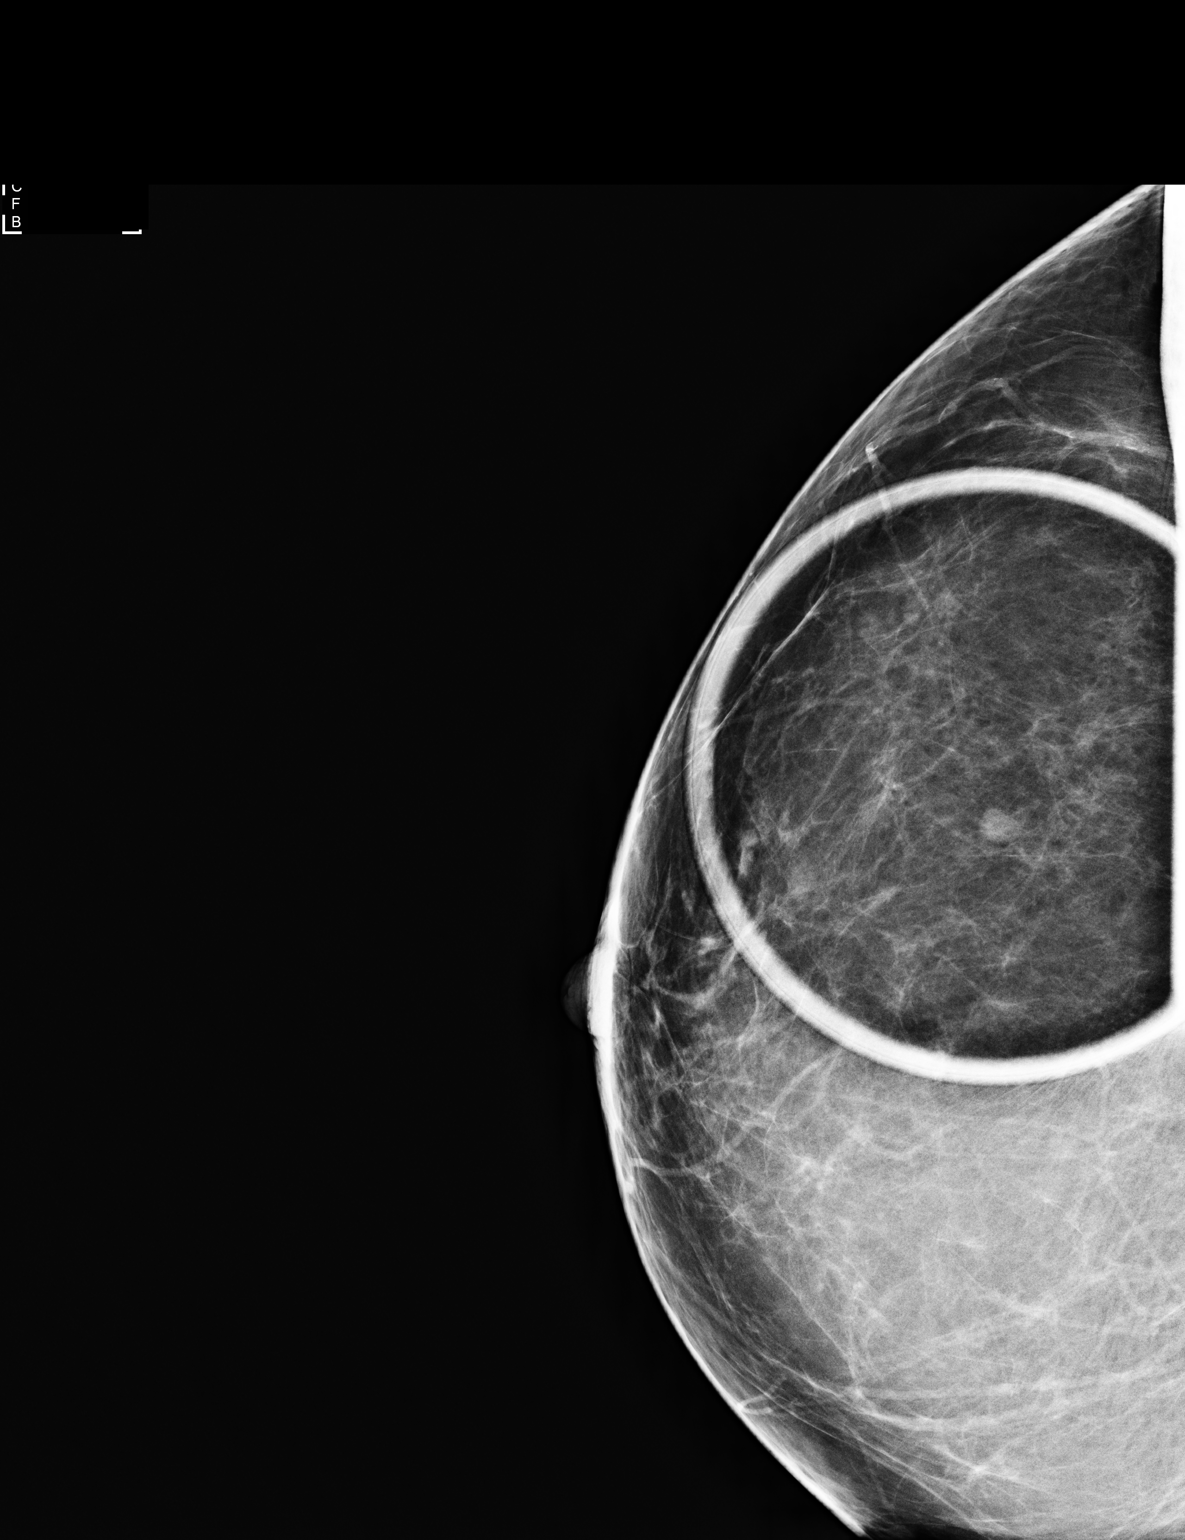

[2 of 2 positions shown; findings below may reference images not displayed]

FINDINGS: Focal spot compression views of the slightly outer right breast near
the level of the nipple demonstrate a 5 mm mass with slightly
indistinct margins anteriorly.

On physical exam, there is subtle focal thickening in the right
breast [DATE] position 3 cm from the nipple.

Ultrasound is performed, showing an irregular hypoechoic mass with
indistinct and slightly spiculated margins that measures 4 x 4 x 4
cm. There is associated posterior acoustic shadowing. No internal
vascular flow is seen.

Ultrasound of the right axilla demonstrates normal right axillary
lymph node. No lymphadenopathy is detected.
IMPRESSION: 4 mm solid mass [DATE] position right breast 3 cm from the nipple.
Appearances of the mass or suspicious for malignancy.

RECOMMENDATION:
Ultrasound-guided biopsy is recommended and the procedure was
discussed with the patient today. She has been scheduled for
ultrasound-guided right breast biopsy 04/05/2014 at 9 o'clock a.m.

I have discussed the findings and recommendations with the patient.
Results were also provided in writing at the conclusion of the
visit. If applicable, a reminder letter will be sent to the patient
regarding the next appointment.

BI-RADS CATEGORY  4: Suspicious.

## 2015-04-29 IMAGING — US US BREAST LTD UNI RIGHT INC AXILLA
1 series · 8 of 8 positions shown · non-contrast
Comparison: 03/06/2014 and earlier priors

ACR Breast Density Category a: The breast tissue is almost entirely
fatty.

ADDENDUM:
This report is created to correct a typographical error in the
"Findings" section of the report. The correct size of the mass seen
on ultrasound should be as follows:

Ultrasound is performed, showing an irregular hypoechoic mass with
indistinct and slightly spiculated margins that measures 4 x 4 x 4
mm (millimeters).
CLINICAL DATA: Possible mass right breast identified on recent
screening mammogram.
EXAM:
DIGITAL DIAGNOSTIC  RIGHT MAMMOGRAM
ULTRASOUND RIGHT BREAST

[Series 1: advbreast · 8 of 8 slices shown]
[im 1/8]
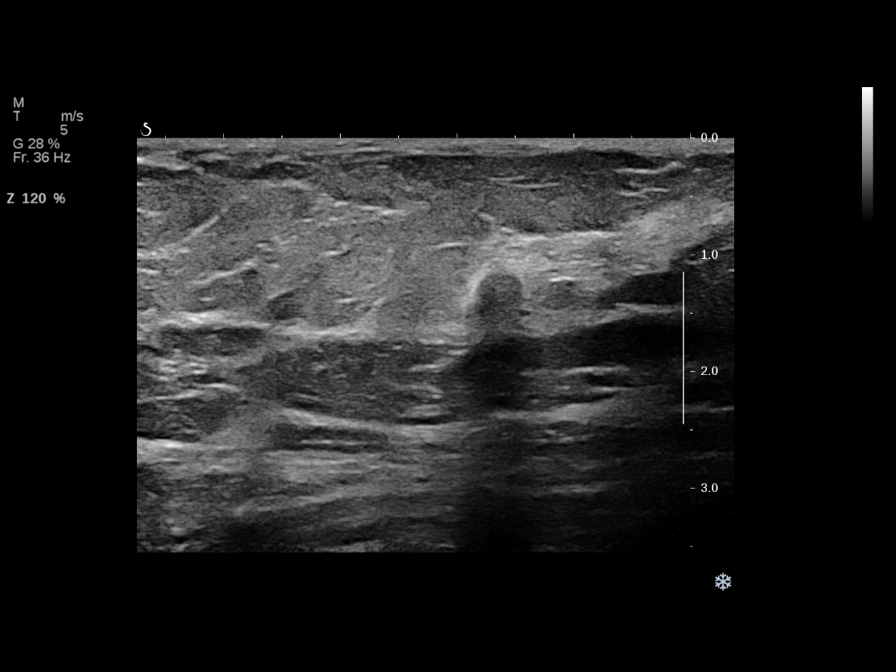
[im 2/8]
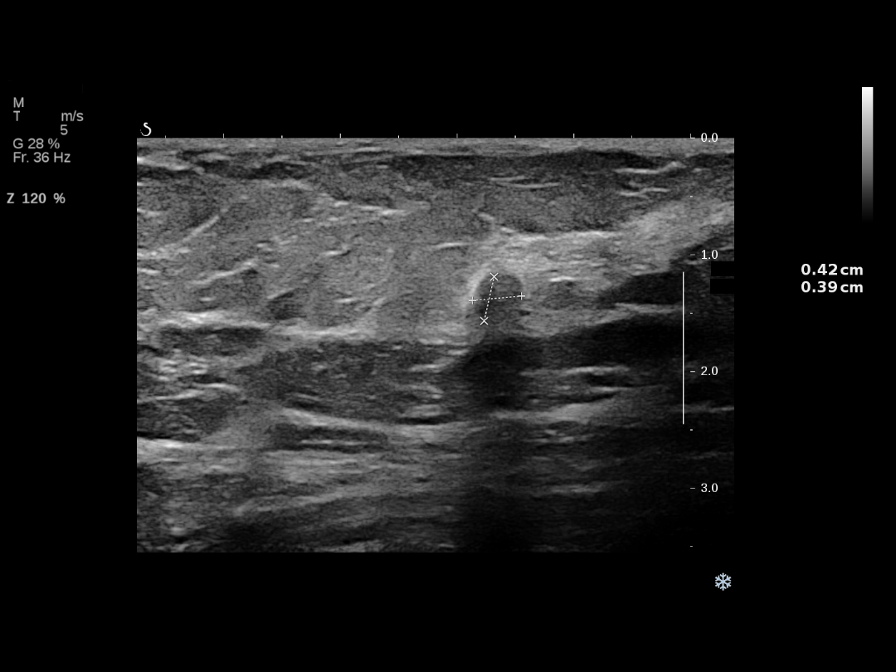
[im 3/8]
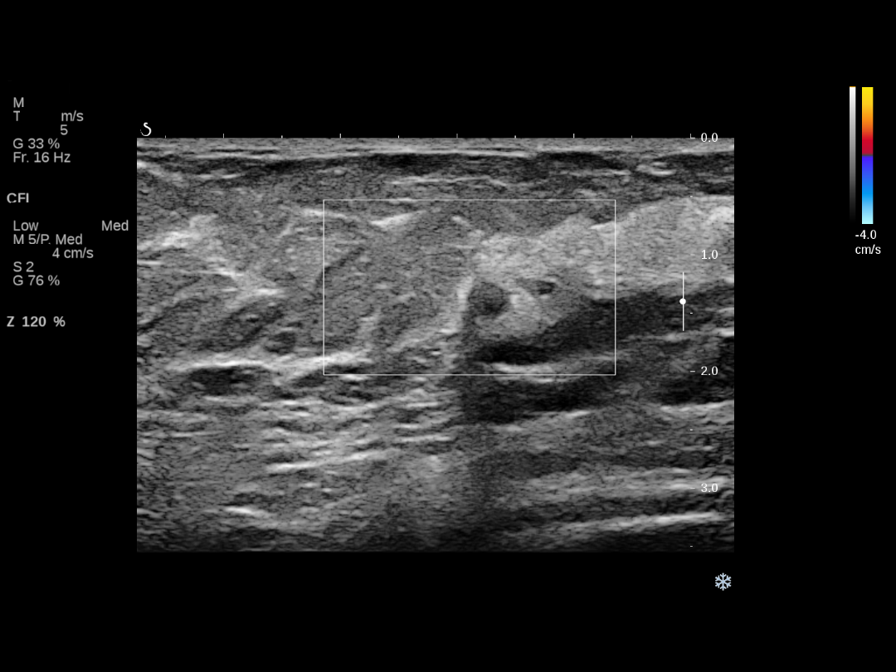
[im 4/8]
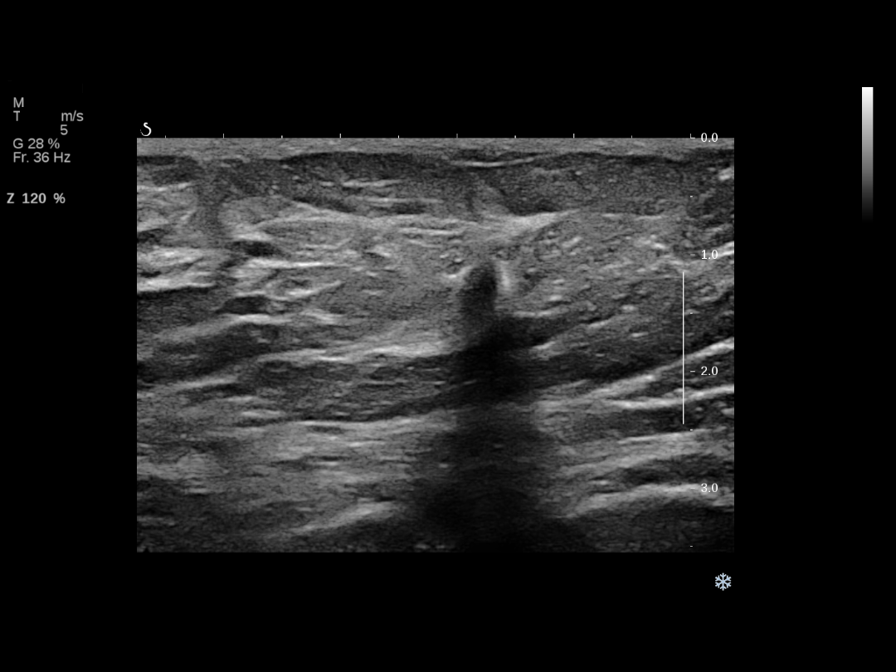
[im 5/8]
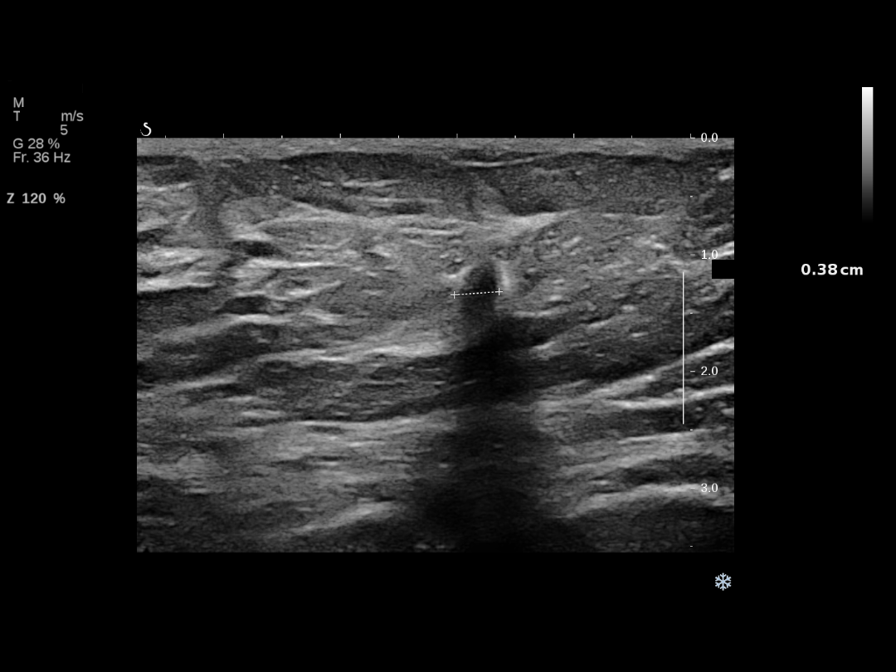
[im 6/8]
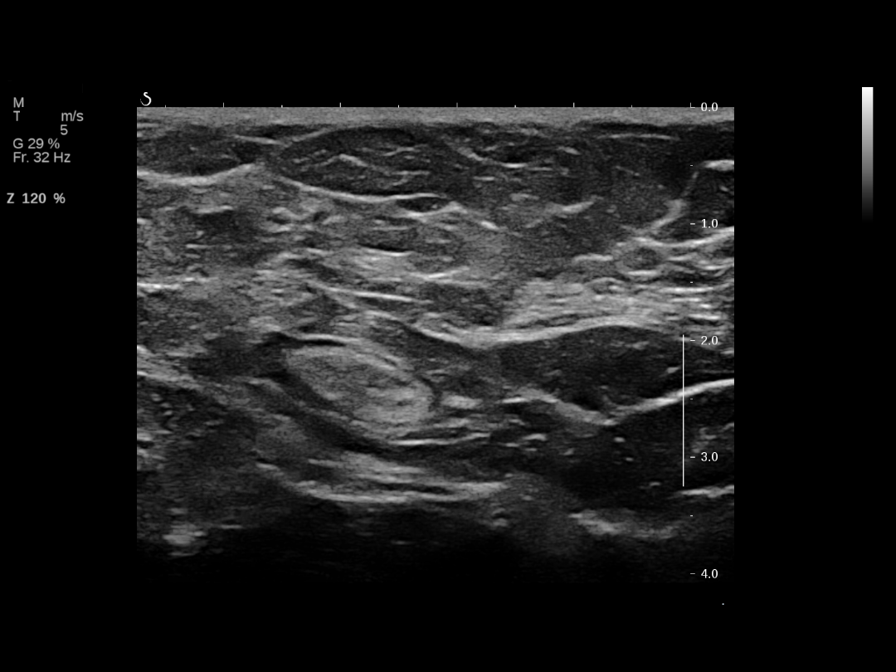
[im 7/8]
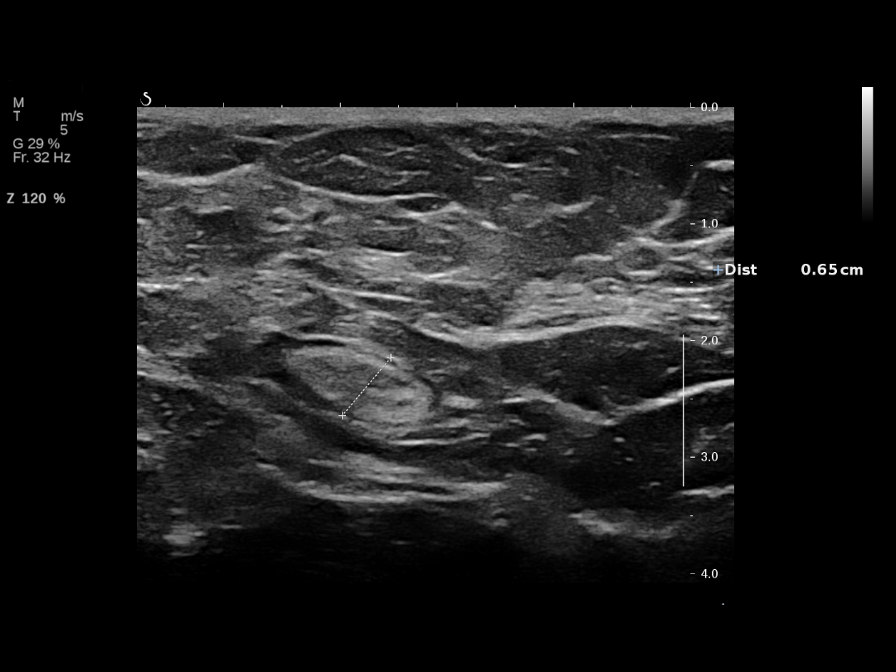
[im 8/8]
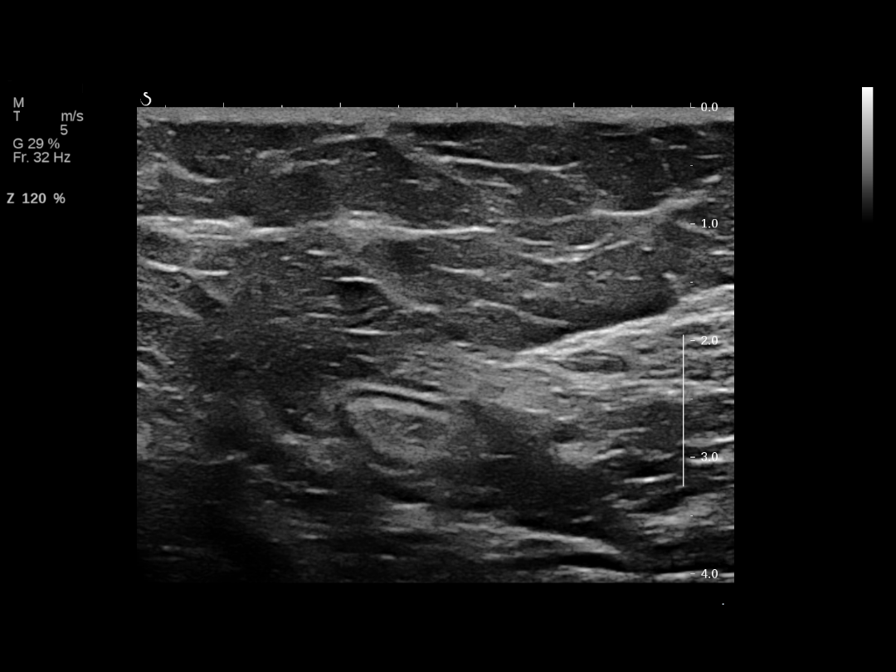

[8 of 8 positions shown; findings below may reference images not displayed]

FINDINGS: Focal spot compression views of the slightly outer right breast near
the level of the nipple demonstrate a 5 mm mass with slightly
indistinct margins anteriorly.

On physical exam, there is subtle focal thickening in the right
breast [DATE] position 3 cm from the nipple.

Ultrasound is performed, showing an irregular hypoechoic mass with
indistinct and slightly spiculated margins that measures 4 x 4 x 4
cm. There is associated posterior acoustic shadowing. No internal
vascular flow is seen.

Ultrasound of the right axilla demonstrates normal right axillary
lymph node. No lymphadenopathy is detected.
IMPRESSION: 4 mm solid mass [DATE] position right breast 3 cm from the nipple.
Appearances of the mass or suspicious for malignancy.

RECOMMENDATION:
Ultrasound-guided biopsy is recommended and the procedure was
discussed with the patient today. She has been scheduled for
ultrasound-guided right breast biopsy 04/05/2014 at 9 o'clock a.m.

I have discussed the findings and recommendations with the patient.
Results were also provided in writing at the conclusion of the
visit. If applicable, a reminder letter will be sent to the patient
regarding the next appointment.

BI-RADS CATEGORY  4: Suspicious.

## 2015-05-09 DIAGNOSIS — Z0100 Encounter for examination of eyes and vision without abnormal findings: Secondary | ICD-10-CM | POA: Diagnosis not present

## 2015-05-09 DIAGNOSIS — H40011 Open angle with borderline findings, low risk, right eye: Secondary | ICD-10-CM | POA: Diagnosis not present

## 2015-05-09 DIAGNOSIS — H40012 Open angle with borderline findings, low risk, left eye: Secondary | ICD-10-CM | POA: Diagnosis not present

## 2015-05-09 DIAGNOSIS — H25013 Cortical age-related cataract, bilateral: Secondary | ICD-10-CM | POA: Diagnosis not present

## 2015-05-21 DIAGNOSIS — C50511 Malignant neoplasm of lower-outer quadrant of right female breast: Secondary | ICD-10-CM | POA: Diagnosis not present

## 2015-06-12 DIAGNOSIS — R7301 Impaired fasting glucose: Secondary | ICD-10-CM | POA: Diagnosis not present

## 2015-06-12 DIAGNOSIS — I10 Essential (primary) hypertension: Secondary | ICD-10-CM | POA: Diagnosis not present

## 2015-06-12 DIAGNOSIS — Z79899 Other long term (current) drug therapy: Secondary | ICD-10-CM | POA: Diagnosis not present

## 2015-06-12 DIAGNOSIS — R7303 Prediabetes: Secondary | ICD-10-CM | POA: Diagnosis not present

## 2015-06-12 DIAGNOSIS — E785 Hyperlipidemia, unspecified: Secondary | ICD-10-CM | POA: Diagnosis not present

## 2015-06-12 DIAGNOSIS — C50911 Malignant neoplasm of unspecified site of right female breast: Secondary | ICD-10-CM | POA: Diagnosis not present

## 2015-10-01 ENCOUNTER — Encounter: Payer: Self-pay | Admitting: Adult Health

## 2015-10-01 NOTE — Progress Notes (Signed)
A birthday card was mailed to the patient today on behalf of the Survivorship Program at [facility].   Halle Davlin, NP Survivorship Program [facility] [phone]  

## 2015-12-10 DIAGNOSIS — C50511 Malignant neoplasm of lower-outer quadrant of right female breast: Secondary | ICD-10-CM | POA: Diagnosis not present

## 2016-02-04 ENCOUNTER — Other Ambulatory Visit: Payer: Self-pay | Admitting: General Surgery

## 2016-02-04 DIAGNOSIS — Z853 Personal history of malignant neoplasm of breast: Secondary | ICD-10-CM

## 2016-02-20 DIAGNOSIS — Z23 Encounter for immunization: Secondary | ICD-10-CM | POA: Diagnosis not present

## 2016-03-09 ENCOUNTER — Ambulatory Visit
Admission: RE | Admit: 2016-03-09 | Discharge: 2016-03-09 | Disposition: A | Discharge status: Home / Self Care | Payer: Medicare Other | Source: Ambulatory Visit | Attending: General Surgery | Admitting: General Surgery

## 2016-03-09 DIAGNOSIS — Z853 Personal history of malignant neoplasm of breast: Secondary | ICD-10-CM

## 2016-03-09 DIAGNOSIS — R928 Other abnormal and inconclusive findings on diagnostic imaging of breast: Secondary | ICD-10-CM | POA: Diagnosis not present

## 2016-06-12 DIAGNOSIS — C50511 Malignant neoplasm of lower-outer quadrant of right female breast: Secondary | ICD-10-CM | POA: Diagnosis not present

## 2016-06-26 DIAGNOSIS — H524 Presbyopia: Secondary | ICD-10-CM | POA: Diagnosis not present

## 2016-06-26 DIAGNOSIS — H2513 Age-related nuclear cataract, bilateral: Secondary | ICD-10-CM | POA: Diagnosis not present

## 2016-06-26 DIAGNOSIS — H25013 Cortical age-related cataract, bilateral: Secondary | ICD-10-CM | POA: Diagnosis not present

## 2016-06-26 DIAGNOSIS — H5203 Hypermetropia, bilateral: Secondary | ICD-10-CM | POA: Diagnosis not present

## 2016-07-06 DIAGNOSIS — R7303 Prediabetes: Secondary | ICD-10-CM | POA: Diagnosis not present

## 2016-07-06 DIAGNOSIS — Z23 Encounter for immunization: Secondary | ICD-10-CM | POA: Diagnosis not present

## 2016-07-06 DIAGNOSIS — I10 Essential (primary) hypertension: Secondary | ICD-10-CM | POA: Diagnosis not present

## 2016-07-06 DIAGNOSIS — E785 Hyperlipidemia, unspecified: Secondary | ICD-10-CM | POA: Diagnosis not present

## 2017-01-25 ENCOUNTER — Other Ambulatory Visit: Payer: Self-pay | Admitting: Family Medicine

## 2017-01-25 DIAGNOSIS — Z853 Personal history of malignant neoplasm of breast: Secondary | ICD-10-CM

## 2017-03-09 DIAGNOSIS — Z23 Encounter for immunization: Secondary | ICD-10-CM | POA: Diagnosis not present

## 2017-03-11 ENCOUNTER — Ambulatory Visit
Admission: RE | Admit: 2017-03-11 | Discharge: 2017-03-11 | Disposition: A | Discharge status: Home / Self Care | Payer: Medicare Other | Source: Ambulatory Visit | Attending: Family Medicine | Admitting: Family Medicine

## 2017-03-11 DIAGNOSIS — R928 Other abnormal and inconclusive findings on diagnostic imaging of breast: Secondary | ICD-10-CM | POA: Diagnosis not present

## 2017-03-11 DIAGNOSIS — Z853 Personal history of malignant neoplasm of breast: Secondary | ICD-10-CM

## 2017-03-11 HISTORY — DX: Personal history of irradiation: Z92.3

## 2017-03-11 HISTORY — DX: Malignant neoplasm of unspecified site of unspecified female breast: C50.919

## 2017-05-20 DIAGNOSIS — H25013 Cortical age-related cataract, bilateral: Secondary | ICD-10-CM | POA: Diagnosis not present

## 2017-05-20 DIAGNOSIS — H2513 Age-related nuclear cataract, bilateral: Secondary | ICD-10-CM | POA: Diagnosis not present

## 2017-05-20 DIAGNOSIS — H40013 Open angle with borderline findings, low risk, bilateral: Secondary | ICD-10-CM | POA: Diagnosis not present

## 2017-06-28 ENCOUNTER — Other Ambulatory Visit: Payer: Self-pay

## 2017-06-28 ENCOUNTER — Ambulatory Visit (INDEPENDENT_AMBULATORY_CARE_PROVIDER_SITE_OTHER): Payer: Medicare Other | Admitting: Family Medicine

## 2017-06-28 ENCOUNTER — Encounter: Payer: Self-pay | Admitting: Family Medicine

## 2017-06-28 VITALS — BP 142/76 | HR 88 | Temp 97.8°F | Resp 16 | Ht 70.0 in | Wt 230.0 lb

## 2017-06-28 DIAGNOSIS — S3992XA Unspecified injury of lower back, initial encounter: Secondary | ICD-10-CM | POA: Diagnosis not present

## 2017-06-28 DIAGNOSIS — R7303 Prediabetes: Secondary | ICD-10-CM

## 2017-06-28 DIAGNOSIS — I10 Essential (primary) hypertension: Secondary | ICD-10-CM | POA: Diagnosis not present

## 2017-06-28 DIAGNOSIS — E785 Hyperlipidemia, unspecified: Secondary | ICD-10-CM

## 2017-06-28 DIAGNOSIS — Z0000 Encounter for general adult medical examination without abnormal findings: Secondary | ICD-10-CM | POA: Diagnosis not present

## 2017-06-28 NOTE — Assessment & Plan Note (Signed)
Will monitor off medications Given parameters to call if consistently higher than 140/90 at home

## 2017-06-28 NOTE — Patient Instructions (Addendum)
Call me if consistently 140/90 [facility] colonoscopy  Release of records- [facility]  We will call with lab results F/U 1 year or as needed

## 2017-06-28 NOTE — Progress Notes (Signed)
Subjective:    Patient ID: Bonnie Maddox, female    DOB: 12-01-1940, 77 y.o.   MRN: [id number]  Patient presents for New Patient~ Establish Care (is fasting)   Previous PCP- Sadie Haber at [facility]- Dr. Chapman Fitch   No longer follows with oncology/ General surgery    History of Right sided breast cancer- hadlumpectimy 2016, Mammogram UTD  Oct 2018 Has dentist  Catarct- Dr. Prudencio Burly next Tuesday will need both    History OA of hips, s/p arthroplasty- takes Aleve Past few weeks tenderness at tailbone, after on her knees scrubbing floors, no specific injury  HTN- was on atenolol in 2001 , then transitioned to metoprolol after atenolol went off the market.  But began to have leg pains, so attributed to metoprolol so she tapered off over 2 weeks in December, BP has been 110-147/64-76 HR 50-60's    Borderline hyperlipidemia since her 15's- declines statins, she is concerned about myalgias, sisters did not tolerate   Borderline DM- monitors with diet   Concerned about thyroid, hair thins, noticed changes in memory , no family history of dementia   Colonoscopy-  Every 10 years, 2014  Immunizations- Pneumonia UTD, Flu UTD, Shingles- DUE ,  TDAP- 2015   Bone Density- okay in the past   Takes MulitiVitamins daily, calcium, fish oil, garlique, B12   No falls Hearing Normal Married   No assistance with ADLS or money    Review Of Systems:  GEN- denies fatigue, fever, weight loss,weakness, recent illness HEENT- denies eye drainage, change in vision, nasal discharge, CVS- denies chest pain, palpitations RESP- denies SOB, cough, wheeze ABD- denies N/V, change in stools, abd pain GU- denies dysuria, hematuria, dribbling, incontinence MSK- + joint pain, muscle aches, injury Neuro- denies headache, dizziness, syncope, seizure activity       Objective:    BP (!) 142/76   Pulse 88   Temp 97.8 F (36.6 C) (Oral)   Resp 16   Ht 5\' 10"  (1.778 m)   Wt 230 lb (104.3 kg)   SpO2 99%   BMI  33.00 kg/m  GEN- NAD, alert and oriented x3 HEENT- PERRL, EOMI, non injected sclera, pink conjunctiva, MMM, oropharynx clear Neck- Supple, no thyromegaly,carotids clear  CVS- RRR,soft 2/6 SEM RESP-CTAB ABD-NABS,soft,NT,ND MSK- Spine NT, tailbone mild TTP, neg hip rock, fair ROM Hips/knees EXT- ankle bilat  edema Pulses- Radial, DP- 2+        Assessment & Plan:      Problem List Items Addressed This Visit      Unprioritized   Hypertension    Will monitor off medications Given parameters to call if consistently higher than 140/90 at home      Relevant Orders   TSH    Other Visit Diagnoses    Routine general medical examination at a health care facility    -  Primary   CPE done fasting labs, obtain records, will order shingles vaccine. discussed brain teasers, puzzles for memory  Cataract surgery next week Hearing in tact Discussed HPOA, living will, she is gettting set up with her husband   Relevant Orders   CBC with Differential/Platelet   Comprehensive metabolic panel   TSH   Borderline diabetes       Relevant Orders   Hemoglobin A1c   Mild hyperlipidemia       Relevant Orders   Lipid panel   Injury of coccyx, initial encounter       epson salt soak, NSAID  Note: This dictation was prepared with Dragon dictation along with smaller phrase technology. Any transcriptional errors that result from this process are unintentional.

## 2017-06-29 LAB — CBC WITH DIFFERENTIAL/PLATELET
Basophils Absolute: 32 cells/uL (ref 0–200)
Basophils Relative: 0.7 %
Eosinophils Absolute: 60 cells/uL (ref 15–500)
Eosinophils Relative: 1.3 %
HCT: 35.7 % (ref 35.0–45.0)
Hemoglobin: 12.4 g/dL (ref 11.7–15.5)
Lymphs Abs: 1265 cells/uL (ref 850–3900)
MCH: 34.3 pg — ABNORMAL HIGH (ref 27.0–33.0)
MCHC: 34.7 g/dL (ref 32.0–36.0)
MCV: 98.6 fL (ref 80.0–100.0)
MPV: 10.1 fL (ref 7.5–12.5)
Monocytes Relative: 7 %
Neutro Abs: 2921 cells/uL (ref 1500–7800)
Neutrophils Relative %: 63.5 %
Platelets: 192 10*3/uL (ref 140–400)
RBC: 3.62 10*6/uL — ABNORMAL LOW (ref 3.80–5.10)
RDW: 13.3 % (ref 11.0–15.0)
Total Lymphocyte: 27.5 %
WBC mixed population: 322 cells/uL (ref 200–950)
WBC: 4.6 10*3/uL (ref 3.8–10.8)

## 2017-06-29 LAB — HEMOGLOBIN A1C
Hgb A1c MFr Bld: 5.7 % of total Hgb — ABNORMAL HIGH (ref <5.7)
Mean Plasma Glucose: 117 (calc)
eAG (mmol/L): 6.5 (calc)

## 2017-06-29 LAB — COMPREHENSIVE METABOLIC PANEL
AG Ratio: 1.7 (calc) (ref 1.0–2.5)
ALT: 22 U/L (ref 6–29)
AST: 24 U/L (ref 10–35)
Albumin: 4.6 g/dL (ref 3.6–5.1)
Alkaline phosphatase (APISO): 74 U/L (ref 33–130)
BUN/Creatinine Ratio: 26 (calc) — ABNORMAL HIGH (ref 6–22)
BUN: 15 mg/dL (ref 7–25)
CO2: 28 mmol/L (ref 20–32)
Calcium: 9.7 mg/dL (ref 8.6–10.4)
Chloride: 104 mmol/L (ref 98–110)
Creat: 0.58 mg/dL — ABNORMAL LOW (ref 0.60–0.93)
Globulin: 2.7 g/dL (calc) (ref 1.9–3.7)
Glucose, Bld: 118 mg/dL — ABNORMAL HIGH (ref 65–99)
Potassium: 4.4 mmol/L (ref 3.5–5.3)
Sodium: 140 mmol/L (ref 135–146)
Total Bilirubin: 0.7 mg/dL (ref 0.2–1.2)
Total Protein: 7.3 g/dL (ref 6.1–8.1)

## 2017-06-29 LAB — LIPID PANEL
Cholesterol: 246 mg/dL — ABNORMAL HIGH (ref <200)
HDL: 70 mg/dL (ref >50)
LDL Cholesterol (Calc): 148 mg/dL (calc) — ABNORMAL HIGH
Non-HDL Cholesterol (Calc): 176 mg/dL (calc) — ABNORMAL HIGH (ref <130)
Total CHOL/HDL Ratio: 3.5 (calc) (ref <5.0)
Triglycerides: 150 mg/dL — ABNORMAL HIGH (ref <150)

## 2017-06-29 LAB — TSH: TSH: 2.33 mIU/L (ref 0.40–4.50)

## 2017-07-06 DIAGNOSIS — H2512 Age-related nuclear cataract, left eye: Secondary | ICD-10-CM | POA: Diagnosis not present

## 2017-07-06 DIAGNOSIS — H25812 Combined forms of age-related cataract, left eye: Secondary | ICD-10-CM | POA: Diagnosis not present

## 2017-07-06 DIAGNOSIS — H25012 Cortical age-related cataract, left eye: Secondary | ICD-10-CM | POA: Diagnosis not present

## 2017-07-29 ENCOUNTER — Encounter: Payer: Self-pay | Admitting: *Deleted

## 2017-08-17 ENCOUNTER — Ambulatory Visit (INDEPENDENT_AMBULATORY_CARE_PROVIDER_SITE_OTHER): Payer: Medicare Other | Admitting: Family Medicine

## 2017-08-17 ENCOUNTER — Encounter: Payer: Self-pay | Admitting: Family Medicine

## 2017-08-17 ENCOUNTER — Ambulatory Visit
Admission: RE | Admit: 2017-08-17 | Discharge: 2017-08-17 | Disposition: A | Discharge status: Home / Self Care | Payer: Medicare Other | Source: Ambulatory Visit | Attending: Family Medicine | Admitting: Family Medicine

## 2017-08-17 ENCOUNTER — Other Ambulatory Visit: Payer: Self-pay

## 2017-08-17 VITALS — BP 138/72 | HR 68 | Temp 97.9°F | Resp 16 | Ht 70.0 in | Wt 235.0 lb

## 2017-08-17 DIAGNOSIS — Z96643 Presence of artificial hip joint, bilateral: Secondary | ICD-10-CM | POA: Diagnosis not present

## 2017-08-17 DIAGNOSIS — S3992XD Unspecified injury of lower back, subsequent encounter: Secondary | ICD-10-CM

## 2017-08-17 DIAGNOSIS — Z471 Aftercare following joint replacement surgery: Secondary | ICD-10-CM | POA: Diagnosis not present

## 2017-08-17 DIAGNOSIS — M533 Sacrococcygeal disorders, not elsewhere classified: Secondary | ICD-10-CM | POA: Diagnosis not present

## 2017-08-17 NOTE — Patient Instructions (Signed)
Get the xrays  [place], [facility]  F/U as previous

## 2017-08-17 NOTE — Progress Notes (Signed)
   Subjective:    Patient ID: Bonnie Maddox, female    DOB: June 04, 1940, 77 y.o.   MRN: [id number]  Patient presents for Tailbone Pain (x weeks- states that pain to tailbone remains)   Continues to have tailbone pain, she has been stretching, she started with pain mid Jan, at last visit stated she has been doing floors and cleaning when the pain started.  Taking Advil twice a day  Denies low back pain, any radiating symptoms.  She does have bilateral hip replacements. She states that she has been doing some physical therapy exercises that she saw online which has helped she was gardening yesterday and states that her pain is actually improved not sure of the was because she was stretching    Review Of Systems:  GEN- denies fatigue, fever, weight loss,weakness, recent illness HEENT- denies eye drainage, change in vision, nasal discharge, CVS- denies chest pain, palpitations RESP- denies SOB, cough, wheeze ABD- denies N/V, change in stools, abd pain GU- denies dysuria, hematuria, dribbling, incontinence MSK-+ joint pain, muscle aches, injury Neuro- denies headache, dizziness, syncope, seizure activity       Objective:    BP 138/72   Pulse 68   Temp 97.9 F (36.6 C) (Oral)   Resp 16   Ht 5\' 10"  (1.778 m)   Wt 235 lb (106.6 kg)   SpO2 97%   BMI 33.72 kg/m  GEN- NAD, alert and oriented x3 MSK-MSK- Spine NT, tailbone mild TTP, + pain in tailbone with  hip rock, fair ROM Hips/knees Neg SLR, non antalgic gait  EXT- ankle bilat  edema Pulses- Radial,2+         Assessment & Plan:      Problem List Items Addressed This Visit    None    Visit Diagnoses    Injury of coccyx, subsequent encounter    -  Primary   Persistant pain, with history of breast cancer, obtain xrays of pelvix /tailbone, no specific injury, improved with NSAIDS can take prn    Relevant Orders   DG Sacrum/Coccyx   DG HIPS BILAT WITH PELVIS 3-4 VIEWS      Note: This dictation was prepared with Dragon  dictation along with smaller phrase technology. Any transcriptional errors that result from this process are unintentional.

## 2017-09-02 ENCOUNTER — Encounter: Payer: Self-pay | Admitting: Family Medicine

## 2017-09-02 MED ORDER — DICLOFENAC SODIUM 1 % TD GEL: 2.0000 g | Freq: Four times a day (QID) | TRANSDERMAL | 3 refills | Status: DC

## 2018-02-02 ENCOUNTER — Other Ambulatory Visit: Payer: Self-pay | Admitting: Family Medicine

## 2018-02-02 DIAGNOSIS — Z853 Personal history of malignant neoplasm of breast: Secondary | ICD-10-CM

## 2018-02-22 DIAGNOSIS — H2511 Age-related nuclear cataract, right eye: Secondary | ICD-10-CM | POA: Diagnosis not present

## 2018-02-22 DIAGNOSIS — H25811 Combined forms of age-related cataract, right eye: Secondary | ICD-10-CM | POA: Diagnosis not present

## 2018-02-22 DIAGNOSIS — H25011 Cortical age-related cataract, right eye: Secondary | ICD-10-CM | POA: Diagnosis not present

## 2018-02-22 DIAGNOSIS — H25812 Combined forms of age-related cataract, left eye: Secondary | ICD-10-CM | POA: Diagnosis not present

## 2018-03-08 DIAGNOSIS — Z23 Encounter for immunization: Secondary | ICD-10-CM | POA: Diagnosis not present

## 2018-03-10 ENCOUNTER — Other Ambulatory Visit: Payer: Self-pay | Admitting: Family Medicine

## 2018-03-10 ENCOUNTER — Other Ambulatory Visit: Payer: Self-pay

## 2018-03-10 DIAGNOSIS — Z853 Personal history of malignant neoplasm of breast: Secondary | ICD-10-CM

## 2018-03-14 ENCOUNTER — Ambulatory Visit
Admission: RE | Admit: 2018-03-14 | Discharge: 2018-03-14 | Disposition: A | Discharge status: Home / Self Care | Payer: Medicare Other | Source: Ambulatory Visit | Attending: Family Medicine | Admitting: Family Medicine

## 2018-03-14 DIAGNOSIS — Z853 Personal history of malignant neoplasm of breast: Secondary | ICD-10-CM

## 2018-12-19 ENCOUNTER — Other Ambulatory Visit: Payer: Self-pay

## 2019-02-01 ENCOUNTER — Other Ambulatory Visit: Payer: Self-pay | Admitting: Hematology

## 2019-02-01 DIAGNOSIS — Z853 Personal history of malignant neoplasm of breast: Secondary | ICD-10-CM

## 2019-03-03 DIAGNOSIS — Z23 Encounter for immunization: Secondary | ICD-10-CM | POA: Diagnosis not present

## 2019-05-08 ENCOUNTER — Encounter: Payer: Self-pay | Admitting: Family Medicine

## 2019-05-09 ENCOUNTER — Emergency Department (HOSPITAL_COMMUNITY)
Admission: EM | Admit: 2019-05-09 | Discharge: 2019-05-09 | Disposition: A | Discharge status: Home / Self Care | Payer: Medicare Other | Attending: Emergency Medicine | Admitting: Emergency Medicine

## 2019-05-09 ENCOUNTER — Encounter (HOSPITAL_COMMUNITY): Payer: Self-pay | Admitting: Emergency Medicine

## 2019-05-09 ENCOUNTER — Emergency Department (HOSPITAL_COMMUNITY): Payer: Medicare Other

## 2019-05-09 DIAGNOSIS — R799 Abnormal finding of blood chemistry, unspecified: Secondary | ICD-10-CM | POA: Insufficient documentation

## 2019-05-09 DIAGNOSIS — Z87891 Personal history of nicotine dependence: Secondary | ICD-10-CM | POA: Insufficient documentation

## 2019-05-09 DIAGNOSIS — I10 Essential (primary) hypertension: Secondary | ICD-10-CM | POA: Insufficient documentation

## 2019-05-09 DIAGNOSIS — N390 Urinary tract infection, site not specified: Secondary | ICD-10-CM | POA: Insufficient documentation

## 2019-05-09 DIAGNOSIS — Z853 Personal history of malignant neoplasm of breast: Secondary | ICD-10-CM | POA: Insufficient documentation

## 2019-05-09 DIAGNOSIS — I471 Supraventricular tachycardia: Secondary | ICD-10-CM | POA: Insufficient documentation

## 2019-05-09 DIAGNOSIS — K4090 Unilateral inguinal hernia, without obstruction or gangrene, not specified as recurrent: Secondary | ICD-10-CM

## 2019-05-09 DIAGNOSIS — R1084 Generalized abdominal pain: Secondary | ICD-10-CM | POA: Diagnosis present

## 2019-05-09 DIAGNOSIS — Z96643 Presence of artificial hip joint, bilateral: Secondary | ICD-10-CM | POA: Insufficient documentation

## 2019-05-09 DIAGNOSIS — Z79899 Other long term (current) drug therapy: Secondary | ICD-10-CM | POA: Diagnosis not present

## 2019-05-09 LAB — CBC WITH DIFFERENTIAL/PLATELET
Abs Immature Granulocytes: 0.04 10*3/uL (ref 0.00–0.07)
Basophils Absolute: 0 10*3/uL (ref 0.0–0.1)
Basophils Relative: 0 %
Eosinophils Absolute: 0 10*3/uL (ref 0.0–0.5)
Eosinophils Relative: 0 %
HCT: 38.3 % (ref 36.0–46.0)
Hemoglobin: 13.1 g/dL (ref 12.0–15.0)
Immature Granulocytes: 1 %
Lymphocytes Relative: 6 %
Lymphs Abs: 0.5 10*3/uL — ABNORMAL LOW (ref 0.7–4.0)
MCH: 35.3 pg — ABNORMAL HIGH (ref 26.0–34.0)
MCHC: 34.2 g/dL (ref 30.0–36.0)
MCV: 103.2 fL — ABNORMAL HIGH (ref 80.0–100.0)
Monocytes Absolute: 0.3 10*3/uL (ref 0.1–1.0)
Monocytes Relative: 3 %
Neutro Abs: 7.5 10*3/uL (ref 1.7–7.7)
Neutrophils Relative %: 90 %
Platelets: 213 10*3/uL (ref 150–400)
RBC: 3.71 MIL/uL — ABNORMAL LOW (ref 3.87–5.11)
RDW: 13.3 % (ref 11.5–15.5)
WBC: 8.4 10*3/uL (ref 4.0–10.5)
nRBC: 0 % (ref 0.0–0.2)

## 2019-05-09 LAB — COMPREHENSIVE METABOLIC PANEL
ALT: 32 U/L (ref 0–44)
AST: 28 U/L (ref 15–41)
Albumin: 4 g/dL (ref 3.5–5.0)
Alkaline Phosphatase: 68 U/L (ref 38–126)
Anion gap: 13 (ref 5–15)
BUN: 18 mg/dL (ref 8–23)
CO2: 20 mmol/L — ABNORMAL LOW (ref 22–32)
Calcium: 9.2 mg/dL (ref 8.9–10.3)
Chloride: 106 mmol/L (ref 98–111)
Creatinine, Ser: 0.68 mg/dL (ref 0.44–1.00)
GFR calc Af Amer: >60 mL/min (ref >60)
GFR calc non Af Amer: >60 mL/min (ref >60)
Glucose, Bld: 154 mg/dL — ABNORMAL HIGH (ref 70–99)
Potassium: 4.2 mmol/L (ref 3.5–5.1)
Sodium: 139 mmol/L (ref 135–145)
Total Bilirubin: 1 mg/dL (ref 0.3–1.2)
Total Protein: 6.3 g/dL — ABNORMAL LOW (ref 6.5–8.1)

## 2019-05-09 LAB — LACTIC ACID, PLASMA
Lactic Acid, Venous: 3.4 mmol/L (ref 0.5–1.9)
Lactic Acid, Venous: 2.8 mmol/L (ref 0.5–1.9)
Lactic Acid, Venous: 1.5 mmol/L (ref 0.5–1.9)

## 2019-05-09 LAB — TROPONIN I (HIGH SENSITIVITY)
Troponin I (High Sensitivity): 12 ng/L (ref <18)
Troponin I (High Sensitivity): 13 ng/L (ref <18)

## 2019-05-09 LAB — URINALYSIS, ROUTINE W REFLEX MICROSCOPIC
Bilirubin Urine: NEGATIVE
Glucose, UA: NEGATIVE mg/dL
Hgb urine dipstick: NEGATIVE
Ketones, ur: NEGATIVE mg/dL
Leukocytes,Ua: NEGATIVE
Nitrite: POSITIVE — AB
Protein, ur: NEGATIVE mg/dL
Specific Gravity, Urine: 1.019 (ref 1.005–1.030)
pH: 7 (ref 5.0–8.0)

## 2019-05-09 LAB — LIPASE, BLOOD: Lipase: 20 U/L (ref 11–51)

## 2019-05-09 MED ORDER — SODIUM CHLORIDE 0.9 % IV BOLUS
1000.0000 mL | Freq: Once | INTRAVENOUS | Status: AC
  Administered 2019-05-09: 1000 mL via INTRAVENOUS

## 2019-05-09 MED ORDER — SODIUM CHLORIDE 0.9 % IV SOLN
1.0000 g | Freq: Once | INTRAVENOUS | Status: AC
  Administered 2019-05-09: 16:00:00 1 g via INTRAVENOUS
  Filled 2019-05-09: qty 10

## 2019-05-09 MED ORDER — CEPHALEXIN 500 MG PO CAPS: 500.0000 mg | ORAL_CAPSULE | Freq: Two times a day (BID) | ORAL | 0 refills | Status: DC

## 2019-05-09 MED ORDER — IOHEXOL 300 MG/ML  SOLN
100.0000 mL | Freq: Once | INTRAMUSCULAR | Status: AC | PRN
  Administered 2019-05-09: 100 mL via INTRAVENOUS

## 2019-05-09 NOTE — ED Notes (Signed)
Patient transported to CT 

## 2019-05-09 NOTE — Discharge Instructions (Signed)
You will receive a call for follow-up with the cardiology office. Please follow-up regarding the palpitations you had during your ED stay.

## 2019-05-09 NOTE — ED Provider Notes (Signed)
Repeat lactic, if normal and taking PO and ambulating. OK for discharge. If lactic elevated, reconsult Dr. Bobbye Morton. Physical Exam  BP (!) 164/80   Pulse 78   Temp 98.2 F (36.8 C) (Oral)   Resp 19   SpO2 100%   Physical Exam  ED Course/Procedures     Procedures  MDM         Charlesetta Shanks, MD 05/09/19 1635

## 2019-05-09 NOTE — ED Notes (Signed)
Patient verbalizes understanding of discharge instructions. Opportunity for questioning and answers were provided. Armband removed by staff, pt discharged from ED ambulatory.   

## 2019-05-09 NOTE — Consult Note (Signed)
[facility] Surgery Consult Note  Bonnie Maddox 01/01/1941  [id number].    Requesting MD: Quintella Reichert Chief Complaint/Reason for Consult: right inguinal hernia  HPI:  Bonnie Maddox is a 78yo female with h/o breast cancer s/p right lumpectomy with radioactive seed localization and sentinel node mapping 05/17/2014 by Dr. Marlou Starks and radiation therapy, who presented to [facility] today complaining of acute onset abdominal pain. States that the pain started around 0200. It was central, sharp, and severe. She does report that her hernia was firm and tender at that time as well. Associated with nausea, no emesis. Last BM was this morning and normal. States that she has never had pain like this before.  She was worked up in the ED. Lactic acid elevated at 3.4 and trended down to 2.8 with fluids. WBC 8.4, afebrile. CT scan showed large right inguinal hernia containing small bowel without evidence of bowel obstruction, cannot exclude incarceration, no wall thickening to indicate strangulation/ischemia.  General surgery asked to see. Patient now states that her abdominal pain has completely resolved. Denies n/v. Hernia is soft and nontender.   Abdominal surgical history: tubal ligation, appendectomy Anticoagulants: none Nonsmoker Lives at home with her husband   ROS: Review of Systems  Constitutional: Negative.   HENT: Negative.   Eyes: Negative.   Respiratory: Negative.   Cardiovascular: Negative.   Gastrointestinal: Positive for abdominal pain and nausea. Negative for constipation, diarrhea and vomiting.  Genitourinary: Negative.   Musculoskeletal: Positive for back pain.  Skin: Negative.   Neurological: Negative.    All systems reviewed and otherwise negative except for as above  Family History  Problem Relation Age of Onset  . Breast cancer Sister     Past Medical History:  Diagnosis Date  . Arthritis   . Breast cancer ([place])   . Breast cancer of lower-outer quadrant of right  female breast ([place]) 04/06/2014  . Hypertension   . Personal history of radiation therapy   . Wears glasses     Past Surgical History:  Procedure Laterality Date  . APPENDECTOMY    . BREAST BIOPSY    . BREAST LUMPECTOMY Right    2015  . BREAST LUMPECTOMY WITH NEEDLE LOCALIZATION AND AXILLARY SENTINEL LYMPH NODE BX Right 05/17/2014   Procedure: RIGHT BREAST LUMPECTOMY WITH RADIOACTIVE SEED LOCALIZATION AND SENTINEL NODE MAPPING;  Surgeon: Autumn Messing III, MD;  Location: [place];  Service: General;  Laterality: Right;  . COLONOSCOPY    . TOTAL HIP ARTHROPLASTY  2001,2002   left and right  . TUBAL LIGATION      Social History:  reports that she quit smoking about 50 years ago. Her smoking use included cigarettes. She smoked 1.00 pack per day. She has never used smokeless tobacco. She reports current alcohol use. She reports that she does not use drugs.  Allergies: No Known Allergies  (Not in a hospital admission)   Prior to Admission medications   Medication Sig Start Date End Date Taking? Authorizing Provider  Cyanocobalamin (VITAMIN B 12 PO) Take 1,000 mcg by mouth.    [provider]  diclofenac sodium (VOLTAREN) 1 % GEL Apply 2 g topically 4 (four) times daily. 09/02/17   [place], Modena Nunnery, MD  Garlic (GARLIQUE) [id number] MG TBEC Take by mouth.    [provider]  Magnesium Citrate 200 MG TABS Take by mouth.    [provider]  metoprolol succinate (TOPROL-XL) 50 MG 24 hr tablet Take 50 mg by mouth daily. Take with  or immediately following a meal.    [provider]  Multiple Vitamin (MULTIVITAMIN WITH MINERALS) TABS tablet Take 1 tablet by mouth daily.    [provider]  Omega-3 Fatty Acids (FISH OIL) 1200 MG CAPS Take by mouth.    [provider]    Blood pressure (!) 164/80, pulse 78, temperature 98.2 F (36.8 C), temperature source Oral, resp. rate 19, SpO2 100 %. Physical Exam: General: pleasant, WD/WN white  female who is laying in bed in NAD HEENT: head is normocephalic, atraumatic.  Sclera are noninjected.  Pupils equal and round.  Ears and nose without any masses or lesions.  Mouth is pink and moist. Dentition fair Heart: regular, rate, and rhythm.  No obvious murmurs, gallops, or rubs noted.  Palpable pedal pulses bilaterally Lungs: CTAB, no wheezes, rhonchi, or rales noted.  Respiratory effort nonlabored Abd: soft, ND, completely nontender abdomen, +BS, no masses or organomegaly. Large right inguinal hernia soft and without overlying skin changes, nontender, reducible but quickly recurs  MS: calves soft and nontender Skin: warm and dry with no masses, lesions, or rashes Psych: A&Ox3 with an appropriate affect. Neuro: cranial nerves grossly intact, extremity CSM intact bilaterally, normal speech  Results for orders placed or performed during the hospital encounter of 05/09/19 (from the past 48 hour(s))  Comprehensive metabolic panel     Status: Abnormal   Collection Time: 05/09/19 10:03 AM  Result Value Ref Range   Sodium 139 135 - 145 mmol/L   Potassium 4.2 3.5 - 5.1 mmol/L   Chloride 106 98 - 111 mmol/L   CO2 20 (L) 22 - 32 mmol/L   Glucose, Bld 154 (H) 70 - 99 mg/dL   BUN 18 8 - 23 mg/dL   Creatinine, Ser 0.68 0.44 - 1.00 mg/dL   Calcium 9.2 8.9 - 10.3 mg/dL   Total Protein 6.3 (L) 6.5 - 8.1 g/dL   Albumin 4.0 3.5 - 5.0 g/dL   AST 28 15 - 41 U/L   ALT 32 0 - 44 U/L   Alkaline Phosphatase 68 38 - 126 U/L   Total Bilirubin 1.0 0.3 - 1.2 mg/dL   GFR calc non Af Amer >60 >60 mL/min   GFR calc Af Amer >60 >60 mL/min   Anion gap 13 5 - 15    Comment: Performed at [facility], [address] [place], [place] [address]  Lipase, blood     Status: None   Collection Time: 05/09/19 10:03 AM  Result Value Ref Range   Lipase 20 11 - 51 U/L    Comment: Performed at [facility] [address], [place], [place] [address]  CBC with Differential     Status: Abnormal   Collection  Time: 05/09/19 10:03 AM  Result Value Ref Range   WBC 8.4 4.0 - 10.5 K/uL   RBC 3.71 (L) 3.87 - 5.11 MIL/uL   Hemoglobin 13.1 12.0 - 15.0 g/dL   HCT 38.3 36.0 - 46.0 %   MCV 103.2 (H) 80.0 - 100.0 fL   MCH 35.3 (H) 26.0 - 34.0 pg   MCHC 34.2 30.0 - 36.0 g/dL   RDW 13.3 11.5 - 15.5 %   Platelets 213 150 - 400 K/uL   nRBC 0.0 0.0 - 0.2 %   Neutrophils Relative % 90 %   Neutro Abs 7.5 1.7 - 7.7 K/uL   Lymphocytes Relative 6 %   Lymphs Abs 0.5 (L) 0.7 - 4.0 K/uL   Monocytes Relative 3 %  Monocytes Absolute 0.3 0.1 - 1.0 K/uL   Eosinophils Relative 0 %   Eosinophils Absolute 0.0 0.0 - 0.5 K/uL   Basophils Relative 0 %   Basophils Absolute 0.0 0.0 - 0.1 K/uL   Immature Granulocytes 1 %   Abs Immature Granulocytes 0.04 0.00 - 0.07 K/uL    Comment: Performed at [facility], [place] [address] [place], [place] [address]  Troponin I (High Sensitivity)     Status: None   Collection Time: 05/09/19 10:03 AM  Result Value Ref Range   Troponin I (High Sensitivity) 12 <18 ng/L    Comment: (NOTE) Elevated high sensitivity troponin I (hsTnI) values and significant  changes across serial measurements may suggest ACS but many other  chronic and acute conditions are known to elevate hsTnI results.  Refer to the "Links" section for chest pain algorithms and additional  guidance. Performed at [facility], [place] [address] [place], [place] [address]   Lactic acid, plasma     Status: Abnormal   Collection Time: 05/09/19 10:45 AM  Result Value Ref Range   Lactic Acid, Venous 3.4 (HH) 0.5 - 1.9 mmol/L    Comment: CRITICAL RESULT CALLED TO, READ BACK BY AND VERIFIED WITH: JASPER,H RN @1118  ON [id number] BY FLEMINGS Performed at [facility], [place] [address] [place], [place] [address]   Lactic acid, plasma     Status: Abnormal   Collection Time: 05/09/19 12:32 PM  Result Value Ref Range   Lactic Acid, Venous 2.8 (HH) 0.5 - 1.9 mmol/L    Comment: CRITICAL VALUE NOTED.  VALUE IS  CONSISTENT WITH PREVIOUSLY REPORTED AND CALLED VALUE. Performed at [facility], [place] [address] [place], [place] [address]   Troponin I (High Sensitivity)     Status: None   Collection Time: 05/09/19 12:32 PM  Result Value Ref Range   Troponin I (High Sensitivity) 13 <18 ng/L    Comment: (NOTE) Elevated high sensitivity troponin I (hsTnI) values and significant  changes across serial measurements may suggest ACS but many other  chronic and acute conditions are known to elevate hsTnI results.  Refer to the "Links" section for chest pain algorithms and additional  guidance. Performed at [facility], [place] [address], [place], [place] [address]   Urinalysis, Routine w reflex microscopic     Status: Abnormal   Collection Time: 05/09/19  2:24 PM  Result Value Ref Range   Color, Urine YELLOW YELLOW   APPearance HAZY (A) CLEAR   Specific Gravity, Urine 1.019 1.005 - 1.030   pH 7.0 5.0 - 8.0   Glucose, UA NEGATIVE NEGATIVE mg/dL   Hgb urine dipstick NEGATIVE NEGATIVE   Bilirubin Urine NEGATIVE NEGATIVE   Ketones, ur NEGATIVE NEGATIVE mg/dL   Protein, ur NEGATIVE NEGATIVE mg/dL   Nitrite POSITIVE (A) NEGATIVE   Leukocytes,Ua NEGATIVE NEGATIVE   RBC / HPF 0-5 0 - 5 RBC/hpf   WBC, UA 0-5 0 - 5 WBC/hpf   Bacteria, UA RARE (A) NONE SEEN   Squamous Epithelial / LPF 0-5 0 - 5    Comment: Performed at [facility] [address], [place], [place] [address]   CT Abdomen Pelvis W Contrast  Result Date: 05/09/2019 CLINICAL DATA:  Lower abdominal pain since 2 a.m. EXAM: CT ABDOMEN AND PELVIS WITH CONTRAST TECHNIQUE: Multidetector CT imaging of the abdomen and pelvis was performed using the standard protocol following bolus administration of intravenous contrast. CONTRAST:  166mL OMNIPAQUE IOHEXOL  300 MG/ML  SOLN COMPARISON:  None. FINDINGS: Lower chest: No acute abnormality. Hepatobiliary: No focal liver abnormality is seen. No gallstones, gallbladder wall thickening, or biliary  dilatation. Pancreas: Unremarkable. No pancreatic ductal dilatation or surrounding inflammatory changes. Spleen: Normal in size without focal abnormality. Adrenals/Urinary Tract: Adrenal glands are unremarkable. Kidneys are normal, without renal calculi, focal lesion, or hydronephrosis. Bladder is unremarkable. Stomach/Bowel: Large right inguinal hernia contains small bowel. Bowel within the hernia demonstrates no bowel wall thickening and no dilation. There is fluid within the hernia. Small bowel proximal and distal to the hernia is normal in caliber. No bowel obstruction. Stomach is unremarkable.  No bowel wall thickening or inflammation. Vascular/Lymphatic: Mild aortic atherosclerotic calcifications. No aneurysm. No enlarged lymph nodes. Reproductive: Uterus and bilateral adnexa are unremarkable. Other: Small amount of ascites which not only collects within the right inguinal hernia, but lies adjacent to the liver and spleen, tracks along the right pericolic gutter and collects in the posterior pelvic recess. Musculoskeletal: No fracture or acute finding. No bone lesion. Bilateral total hip arthroplasties appear well seated and aligned. IMPRESSION: 1. Large right inguinal hernia contains small bowel without evidence of bowel obstruction. Cannot exclude incarceration. There is no wall thickening to indicate strangulation/ischemia. 2. Small amount of ascites. This is presumably related to the inguinal hernia. 3. No other acute finding within the abdomen or pelvis. 4. Mild aortic atherosclerosis. Electronically Signed   By: Lajean Manes M.D.   On: 05/09/2019 12:19      Assessment/Plan H/o breast cancer s/p right lumpectomy with radioactive seed localization and sentinel node mapping 05/17/2014, and radiation therapy  Large right inguinal hernia - Patient with known large right inguinal hernia. CT scan shows no findings of obstruction or bowel ischemia. Her abdominal exam is completely benign, and the  hernia is soft and reducible. Lactic acid is trending down. Recommend PO challenge. If she tolerates this and can mobilize without pain she is ok for discharge from surgical standpoint. Plan outpatient follow up with Dr. Marlou Starks.   [place], [place] [facility] 05/09/2019, 3:49 PM Please see Amion for pager number during day hours 7:00am-4:30pm

## 2019-05-09 NOTE — ED Notes (Signed)
Pt tolerating fluids.   

## 2019-05-09 NOTE — ED Triage Notes (Signed)
Pt arrives via [facility] from home where she reports sudden onset of abd pain at 0200 with emesis. Pt states pain was generalized but now seems to be worse in RLQ. Pt was given 4 mg of zofran IV with ems for 1 episode of emesis en route. Pt was hypertensive with ems 224/120- per pt was was taken off of her bp meds.

## 2019-05-09 NOTE — ED Provider Notes (Signed)
[facility] EMERGENCY DEPARTMENT Provider Note   CSN: [id number] Arrival date & time: 05/09/19  [id number]     History Chief Complaint  Patient presents with  . Abdominal Pain    Bonnie Maddox is a 78 y.o. female.  The history is provided by the patient and medical records. No language interpreter was used.  Abdominal Pain  Bonnie Maddox is a 78 y.o. female who presents to the Emergency Department complaining of abdomina pain.  She presents to the ED complaining of abrupt onset generalized lower abdominal pain that woke her from sleep at 0200.  Pain was crampy and radiated to her low back. She had associated emesis x 1.  Pain was progressively worsening but improved on ED assessment. No prior similar sxs.  Denies fevers, chest pain, sob, diarrhea, dysuria.  She does have a known right inguinal hernia - unchanged.  No known covid19 exposures.      Past Medical History:  Diagnosis Date  . Arthritis   . Breast cancer ([place])   . Breast cancer of lower-outer quadrant of right female breast ([place]) 04/06/2014  . Hypertension   . Personal history of radiation therapy   . Wears glasses     Patient Active Problem List   Diagnosis Date Noted  . Hypertension 06/28/2017  . Breast cancer of lower-outer quadrant of right female breast ([place]) 04/06/2014    Past Surgical History:  Procedure Laterality Date  . APPENDECTOMY    . BREAST BIOPSY    . BREAST LUMPECTOMY Right    2015  . BREAST LUMPECTOMY WITH NEEDLE LOCALIZATION AND AXILLARY SENTINEL LYMPH NODE BX Right 05/17/2014   Procedure: RIGHT BREAST LUMPECTOMY WITH RADIOACTIVE SEED LOCALIZATION AND SENTINEL NODE MAPPING;  Surgeon: Autumn Messing III, MD;  Location: [facility];  Service: General;  Laterality: Right;  . COLONOSCOPY    . TOTAL HIP ARTHROPLASTY  2001,2002   left and right  . TUBAL LIGATION       OB History   No obstetric history on file.     Family History  Problem Relation Age of Onset  .  Breast cancer Sister     Social History   Tobacco Use  . Smoking status: Former Smoker    Packs/day: 1.00    Types: Cigarettes    Quit date: 04/11/1969    Years since quitting: 50.1  . Smokeless tobacco: Never Used  Substance Use Topics  . Alcohol use: Yes    Alcohol/week: 0.0 standard drinks    Comment: 21  oz/week  . Drug use: No    Home Medications Prior to Admission medications   Medication Sig Start Date End Date Taking? Authorizing Provider  Cyanocobalamin (VITAMIN B 12 PO) Take 1,000 mcg by mouth.   Yes [provider]  Lidocaine 4 % PTCH Apply 1 patch topically daily as needed (back pain).   Yes [provider]  Magnesium Citrate 200 MG TABS Take 200 mg by mouth daily.    Yes [provider]  Multiple Vitamin (MULTIVITAMIN WITH MINERALS) TABS tablet Take 1 tablet by mouth daily.   Yes [provider]  naproxen sodium (ALEVE) 220 MG tablet Take 220 mg by mouth 2 (two) times daily as needed (back pain).   Yes [provider]  cephALEXin (KEFLEX) 500 MG capsule Take 1 capsule (500 mg total) by mouth 2 (two) times daily. 05/09/19   Quintella Reichert, MD  diclofenac sodium (VOLTAREN) 1 % GEL Apply 2 g topically 4 (  four) times daily. Patient not taking: Reported on 05/09/2019 09/02/17   Alycia Rossetti, MD    Allergies    Patient has no known allergies.  Review of Systems   Review of Systems  Gastrointestinal: Positive for abdominal pain.  All other systems reviewed and are negative.   Physical Exam Updated Vital Signs BP (!) 164/80   Pulse 78   Temp 98.2 F (36.8 C) (Oral)   Resp 19   SpO2 100%   Physical Exam Vitals and nursing note reviewed.  Constitutional:      Appearance: She is well-developed.  HENT:     Head: Normocephalic and atraumatic.  Cardiovascular:     Rate and Rhythm: Normal rate and regular rhythm.     Heart sounds: No murmur.  Pulmonary:     Effort: Pulmonary effort is normal. No respiratory  distress.     Breath sounds: Normal breath sounds.  Abdominal:     Palpations: Abdomen is soft.     Tenderness: There is no abdominal tenderness. There is no guarding or rebound.     Comments: Large right inguinal hernia that is easily reduced on exam.    Musculoskeletal:        General: No tenderness.  Skin:    General: Skin is warm and dry.  Neurological:     Mental Status: She is alert and oriented to person, place, and time.  Psychiatric:        Behavior: Behavior normal.     ED Results / Procedures / Treatments   Labs (all labs ordered are listed, but only abnormal results are displayed) Labs Reviewed  COMPREHENSIVE METABOLIC PANEL - Abnormal; Notable for the following components:      Result Value   CO2 20 (*)    Glucose, Bld 154 (*)    Total Protein 6.3 (*)    All other components within normal limits  CBC WITH DIFFERENTIAL/PLATELET - Abnormal; Notable for the following components:   RBC 3.71 (*)    MCV 103.2 (*)    MCH 35.3 (*)    Lymphs Abs 0.5 (*)    All other components within normal limits  URINALYSIS, ROUTINE W REFLEX MICROSCOPIC - Abnormal; Notable for the following components:   APPearance HAZY (*)    Nitrite POSITIVE (*)    Bacteria, UA RARE (*)    All other components within normal limits  LACTIC ACID, PLASMA - Abnormal; Notable for the following components:   Lactic Acid, Venous 3.4 (*)    All other components within normal limits  LACTIC ACID, PLASMA - Abnormal; Notable for the following components:   Lactic Acid, Venous 2.8 (*)    All other components within normal limits  URINE CULTURE  LIPASE, BLOOD  LACTIC ACID, PLASMA  LACTIC ACID, PLASMA  TROPONIN I (HIGH SENSITIVITY)  TROPONIN I (HIGH SENSITIVITY)    EKG EKG Interpretation  Date/Time:  Tuesday May 09 2019 09:26:51 EST Ventricular Rate:  68 PR Interval:    QRS Duration: 123 QT Interval:  445 QTC Calculation: 474 R Axis:   -42 Text Interpretation: Sinus rhythm Nonspecific IVCD  with LAD Inferior infarct, old No significant change since last tracing Confirmed by Quintella Reichert [phone]) on 05/09/2019 11:26:42 AM   Radiology CT Abdomen Pelvis W Contrast  Result Date: 05/09/2019 CLINICAL DATA:  Lower abdominal pain since 2 a.m. EXAM: CT ABDOMEN AND PELVIS WITH CONTRAST TECHNIQUE: Multidetector CT imaging of the abdomen and pelvis was performed using the standard protocol following bolus administration of  intravenous contrast. CONTRAST:  172mL OMNIPAQUE IOHEXOL 300 MG/ML  SOLN COMPARISON:  None. FINDINGS: Lower chest: No acute abnormality. Hepatobiliary: No focal liver abnormality is seen. No gallstones, gallbladder wall thickening, or biliary dilatation. Pancreas: Unremarkable. No pancreatic ductal dilatation or surrounding inflammatory changes. Spleen: Normal in size without focal abnormality. Adrenals/Urinary Tract: Adrenal glands are unremarkable. Kidneys are normal, without renal calculi, focal lesion, or hydronephrosis. Bladder is unremarkable. Stomach/Bowel: Large right inguinal hernia contains small bowel. Bowel within the hernia demonstrates no bowel wall thickening and no dilation. There is fluid within the hernia. Small bowel proximal and distal to the hernia is normal in caliber. No bowel obstruction. Stomach is unremarkable.  No bowel wall thickening or inflammation. Vascular/Lymphatic: Mild aortic atherosclerotic calcifications. No aneurysm. No enlarged lymph nodes. Reproductive: Uterus and bilateral adnexa are unremarkable. Other: Small amount of ascites which not only collects within the right inguinal hernia, but lies adjacent to the liver and spleen, tracks along the right pericolic gutter and collects in the posterior pelvic recess. Musculoskeletal: No fracture or acute finding. No bone lesion. Bilateral total hip arthroplasties appear well seated and aligned. IMPRESSION: 1. Large right inguinal hernia contains small bowel without evidence of bowel obstruction. Cannot  exclude incarceration. There is no wall thickening to indicate strangulation/ischemia. 2. Small amount of ascites. This is presumably related to the inguinal hernia. 3. No other acute finding within the abdomen or pelvis. 4. Mild aortic atherosclerosis. Electronically Signed   By: Lajean Manes M.D.   On: 05/09/2019 12:19    Procedures Procedures (including critical care time)  Medications Ordered in ED Medications  sodium chloride 0.9 % bolus 1,000 mL (0 mLs Intravenous Stopped 05/09/19 1608)  iohexol (OMNIPAQUE) 300 MG/ML solution 100 mL (100 mLs Intravenous Contrast Given 05/09/19 1209)  cefTRIAXone (ROCEPHIN) 1 g in sodium chloride 0.9 % 100 mL IVPB (1 g Intravenous New Bag/Given 05/09/19 1607)    ED Course  I have reviewed the triage vital signs and the nursing notes.  Pertinent labs & imaging results that were available during my care of the patient were reviewed by me and considered in my medical decision making (see chart for details).    MDM Rules/Calculators/A&P                      Patient here for evaluation of lower abdominal pain, has a known right inguinal hernia. Hernia is easily reducible on examination. Initial lactic acid is mildly elevated but there is no clear evidence of sepsis that this time. On imaging she has evidence of stated hernia with no clear incarceration on the scan. Lactic acid is improving during her ED stay. During her ED stay she did have a run of tachycardia. Discussed telemetry strip with Dr. Radford Pax, with cardiology favor SVT as the etiology of the rhythm. During the rhythm she felt a slight flutter in her chest but was otherwise asymptomatic. Multiple repeat assessment she has been asymptomatic. Urinalysis with nitrite positive as well as rare bacteria, will treat with antibiotics for possible early UTI. Plan to recheck lactic acid. Discussed with general surgery patients inguinal hernia.  If repeat lactic acid is down trending and patient can tolerate  oral fluids and ambulate without difficulty or pain plan to discharge home with outpatient cardiology in general surgery follow-up. As well as PCP follow-up for possible mild UTI. Patient care transferred pending repeat lactic acid.   Final Clinical Impression(s) / ED Diagnoses Final diagnoses:  Right inguinal hernia  SVT (supraventricular  tachycardia) ([place])  Acute lower UTI    Rx / DC Orders ED Discharge Orders         Ordered    Ambulatory referral to Cardiology     05/09/19 1618    cephALEXin (KEFLEX) 500 MG capsule  2 times daily     05/09/19 1619           Quintella Reichert, MD 05/09/19 1702

## 2019-05-10 ENCOUNTER — Telehealth: Payer: Self-pay | Admitting: Family Medicine

## 2019-05-10 NOTE — Telephone Encounter (Signed)
I called and spoke with patient in regards to her ER visit on yesterday at [facility]. Patient states that she is doing much better today. She is currently not experiencing pain. She is scheduled to follow up with Cardiology on tomorrow for elevated BP and tachycardia. She has also spoken with general surgery over at [facility]. They will be getting patient in to discuss surgical consultation for hernia. They are wanting her BP to be down per patient report before they proceed with surgical intervention. She informed me that she is taking antibiotic given for UTI. Is not having any issues. Advised to follow up with [place] if symptoms persist or symptoms do not clear up post antibiotics. Patient verbalized understanding.

## 2019-05-11 ENCOUNTER — Encounter: Payer: Self-pay | Admitting: *Deleted

## 2019-05-11 ENCOUNTER — Other Ambulatory Visit: Payer: Self-pay

## 2019-05-11 ENCOUNTER — Encounter: Payer: Self-pay | Admitting: Cardiology

## 2019-05-11 ENCOUNTER — Ambulatory Visit (INDEPENDENT_AMBULATORY_CARE_PROVIDER_SITE_OTHER): Payer: Medicare Other | Admitting: Cardiology

## 2019-05-11 VITALS — BP 142/90 | HR 86 | Ht 70.0 in | Wt 234.0 lb

## 2019-05-11 DIAGNOSIS — Z01810 Encounter for preprocedural cardiovascular examination: Secondary | ICD-10-CM | POA: Diagnosis not present

## 2019-05-11 DIAGNOSIS — I10 Essential (primary) hypertension: Secondary | ICD-10-CM

## 2019-05-11 DIAGNOSIS — Z01818 Encounter for other preprocedural examination: Secondary | ICD-10-CM | POA: Diagnosis not present

## 2019-05-11 DIAGNOSIS — R002 Palpitations: Secondary | ICD-10-CM

## 2019-05-11 NOTE — Progress Notes (Signed)
Cardiology Office Note:    Date:  05/11/2019   ID:  Bonnie Maddox, DOB 1941-03-31, MRN [id number]  PCP:  Alycia Rossetti, MD  Cardiologist:  Candee Furbish, MD  Electrophysiologist:  None   Referring MD: Quintella Reichert, MD     History of Present Illness:    Bonnie Maddox is a 78 y.o. female here for the evaluation of palpitations at the request of Dr. Ralene Bathe.  She was in the emergency department on 05/09/2019 with chief complaint of abdominal pain abrupt woke her up from sleep crampy radiating to her low back.  Had a bout of emesis.  No chest pain.  During her stay in the emergency department she did have a run of tachycardia.  Telemetry strip was discussed with Dr. Radford Pax who was at the hospital at the time and favored SVT as the etiology of the rhythm.  She felt a slight flutter in her chest but otherwise had no other complaints.  She was asymptomatic.  She was treated for possible early UTI.  I was able to pull up the SVT in media under photos 05/09/2019.  I do agree that this does look like an atrial tachycardia.  Her baseline ECG does show an interventricular conduction delay as a seen on this telemetry strip.  Bilateral hip replacement. 20 years ago had stress echo which resulted in pharmacologic stress test which showed minimal apical changes, seem to be overall low risk.  Retired Research scientist (life sciences) for years. HR 90's when gardening.   Family with heart Sister, nurse with CAD stent.   Hernia, right inguinal. Been present for 20 years.  Wants to have procedure. ? Risk.  Her surgeon would like her cleared prior to surgery.  Past Medical History:  Diagnosis Date  . Arthritis   . Breast cancer ([place])   . Breast cancer of lower-outer quadrant of right female breast ([place]) 04/06/2014  . Hypertension   . Personal history of radiation therapy   . Wears glasses     Past Surgical History:  Procedure Laterality Date  . APPENDECTOMY    . BREAST BIOPSY    . BREAST LUMPECTOMY Right    2015   . BREAST LUMPECTOMY WITH NEEDLE LOCALIZATION AND AXILLARY SENTINEL LYMPH NODE BX Right 05/17/2014   Procedure: RIGHT BREAST LUMPECTOMY WITH RADIOACTIVE SEED LOCALIZATION AND SENTINEL NODE MAPPING;  Surgeon: Autumn Messing III, MD;  Location: [place] [facility];  Service: General;  Laterality: Right;  . COLONOSCOPY    . TOTAL HIP ARTHROPLASTY  2001,2002   left and right  . TUBAL LIGATION      Current Medications: Current Meds  Medication Sig  . cephALEXin (KEFLEX) 500 MG capsule Take 1 capsule (500 mg total) by mouth 2 (two) times daily.  . Cyanocobalamin (VITAMIN B 12 PO) Take 1,000 mcg by mouth.  . Lidocaine 4 % PTCH Apply 1 patch topically daily as needed (back pain).  . Magnesium Citrate 200 MG TABS Take 200 mg by mouth daily.   . Multiple Vitamin (MULTIVITAMIN WITH MINERALS) TABS tablet Take 1 tablet by mouth daily.  . naproxen sodium (ALEVE) 220 MG tablet Take 220 mg by mouth 2 (two) times daily as needed (back pain).     Allergies:   Patient has no known allergies.   Social History   Socioeconomic History  . Marital status: Married    Spouse name: Not on file  . Number of children: 2  . Years of education: Not on file  . Highest education  level: Not on file  Occupational History  . Occupation: Retired  Tobacco Use  . Smoking status: Former Smoker    Packs/day: 1.00    Types: Cigarettes    Quit date: 04/11/1969    Years since quitting: 50.1  . Smokeless tobacco: Never Used  Substance and Sexual Activity  . Alcohol use: Yes    Alcohol/week: 0.0 standard drinks    Comment: 21  oz/week  . Drug use: No  . Sexual activity: Yes  Other Topics Concern  . Not on file  Social History Narrative  . Not on file   Social Determinants of Health   Financial Resource Strain:   . Difficulty of Paying Living Expenses: Not on file  Food Insecurity:   . Worried About Charity fundraiser in the Last Year: Not on file  . Ran Out of Food in the Last Year: Not on file   Transportation Needs:   . Lack of Transportation (Medical): Not on file  . Lack of Transportation (Non-Medical): Not on file  Physical Activity:   . Days of Exercise per Week: Not on file  . Minutes of Exercise per Session: Not on file  Stress:   . Feeling of Stress : Not on file  Social Connections:   . Frequency of Communication with Friends and Family: Not on file  . Frequency of Social Gatherings with Friends and Family: Not on file  . Attends Religious Services: Not on file  . Active Member of Clubs or Organizations: Not on file  . Attends Archivist Meetings: Not on file  . Marital Status: Not on file     Family History: The patient's family history includes Breast cancer in her sister.  ROS:   Please see the history of present illness.    No fevers chills nausea vomiting syncope bleeding all other systems reviewed and are negative.  EKGs/Labs/Other Studies Reviewed:    The following studies were reviewed today: Mild aortic atherosclerosis seen on CT scan, right inguinal hernia.  05/09/2019.  EKG: As above  Recent Labs: 05/09/2019: ALT 32; BUN 18; Creatinine, Ser 0.68; Hemoglobin 13.1; Platelets 213; Potassium 4.2; Sodium 139  Recent Lipid Panel    Component Value Date/Time   CHOL 246 (H) 06/28/2017 1032   TRIG 150 (H) 06/28/2017 1032   HDL 70 06/28/2017 1032   CHOLHDL 3.5 06/28/2017 1032   LDLCALC 148 (H) 06/28/2017 1032    Physical Exam:    VS:  BP (!) 142/90   Pulse 86   Ht 5\' 10"  (1.778 m)   Wt 234 lb (106.1 kg)   SpO2 98%   BMI 33.58 kg/m     Wt Readings from Last 3 Encounters:  05/11/19 234 lb (106.1 kg)  08/17/17 235 lb (106.6 kg)  06/28/17 230 lb (104.3 kg)     GEN:  Well nourished, well developed in no acute distress HEENT: Normal NECK: No JVD; No carotid bruits LYMPHATICS: No lymphadenopathy CARDIAC: RRR, no murmurs, rubs, gallops RESPIRATORY:  Clear to auscultation without rales, wheezing or rhonchi  ABDOMEN: Soft,  non-tender, non-distended MUSCULOSKELETAL:  No edema; No deformity  SKIN: Warm and dry NEUROLOGIC:  Alert and oriented x 3 PSYCHIATRIC:  Normal affect   ASSESSMENT:    1. Palpitations   2. Essential hypertension   3. Preoperative evaluation to rule out surgical contraindication   4. Encounter for preprocedural cardiovascular examination     PLAN:    In order of problems listed above:  SVT/paroxysmal atrial tachycardia -  I will check an echocardiogram to ensure proper structure and function of her heart.  This tachycardia episode was short-lived.  Fleeting.  Ultimately asymptomatic.  Ultimately, I think treating her conservatively without medications seems fair.  Obviously if palpitations/SVT were to become more of an issue, beta-blocker would be admissible.  Preop cardiac evaluation prior to right inguinal hernia surgery -Since she has back discomfort, walks with a cane, she is unable to achieve greater than 4 METS of activity consistently, sister with coronary artery disease with stent placement, I think we should proceed with a pharmacologic stress test prior to hernia repair to make sure she does not have any high risk features.   Medication Adjustments/Labs and Tests Ordered: Current medicines are reviewed at length with the patient today.  Concerns regarding medicines are outlined above.  Orders Placed This Encounter  Procedures  . Myocardial Perfusion Imaging  . ECHOCARDIOGRAM COMPLETE   No orders of the defined types were placed in this encounter.   Patient Instructions  Medication Instructions:  The current medical regimen is effective;  continue present plan and medications.  *If you need a refill on your cardiac medications before your next appointment, please call your pharmacy*  Testing/Procedures: Your physician has requested that you have an echocardiogram. Echocardiography is a painless test that uses sound waves to create images of your heart. It provides your  doctor with information about the size and shape of your heart and how well your heart's chambers and valves are working. This procedure takes approximately one hour. There are no restrictions for this procedure.  Your physician has requested that you have a lexiscan myoview. For further information please visit [email or web address]. Please follow instruction sheet, as given.  Follow-Up: Follow up as needed after the above testing.  Thank you for choosing [facility]!!         Signed, Candee Furbish, MD  05/11/2019 12:03 PM    [place]

## 2019-05-11 NOTE — Patient Instructions (Addendum)
Medication Instructions:  The current medical regimen is effective;  continue present plan and medications.  If you need a refill on your cardiac medications before your next appointment, please call your pharmacy.   Testing/Procedures: Your physician has requested that you have an echocardiogram. Echocardiography is a painless test that uses sound waves to create images of your heart. It provides your doctor with information about the size and shape of your heart and how well your heart's chambers and valves are working. This procedure takes approximately one hour. There are no restrictions for this procedure.  Your physician has requested that you have a lexiscan myoview. For further information please visit www.[email or web address]. Please follow instruction sheet, as given.  Follow-Up: Follow up as needed after the above testing.  Thank you for choosing [facility]!!       

## 2019-05-12 LAB — URINE CULTURE: Culture: >=100000 — AB

## 2019-05-14 ENCOUNTER — Telehealth: Payer: Self-pay | Admitting: Emergency Medicine

## 2019-05-14 NOTE — Telephone Encounter (Signed)
Post ED Visit - Positive Culture Follow-up  Culture report reviewed by antimicrobial stewardship pharmacist: [place] Team []  Nathan Batchelder, Pharm.D. []  [address], Pharm.D., BCPS AQ-ID []  Heide Guile, Pharm.D., BCPS []  Parks Neptune, Pharm.D., BCPS []  [place], Pharm.D., BCPS, AAHIVP []  [place], Pharm.D., BCPS, AAHIVP []  Legrand Como, PharmD, BCPS []  Salome Arnt, PharmD, BCPS []  Johnnette Gourd, PharmD, BCPS []  Hughes Better, PharmD []  Leeroy Cha, PharmD, BCPS []  Laqueta Linden, PharmD  [place] Team []  [address], PharmD []  Leodis Sias, PharmD []  Lindell Spar, PharmD []  Royetta Asal, Rph []  Graylin Shiver) Rema Fendt, PharmD []  Glennon Mac, PharmD []  Arlyn Dunning, PharmD []  Netta Cedars, PharmD []  Dia Sitter, PharmD []  Leone Haven, PharmD []  Gretta Arab, PharmD []  Theodis Shove, PharmD []  Peggyann Juba, PharmD   Positive urine culture Treated with cephalexin, organism sensitive to the same and no further patient follow-up is required at this time.  Reuel Boom 05/14/2019, 10:18 AM

## 2019-05-14 NOTE — Telephone Encounter (Signed)
Post ED Visit - Positive Culture Follow-up  Culture report reviewed by antimicrobial stewardship pharmacist: [place] Team []  Elenor Quinones, Pharm.D. []  Heide Guile, Pharm.D., BCPS AQ-ID []  Parks Neptune, Pharm.D., BCPS []  Alycia Rossetti, Pharm.D., BCPS []  [place], [place].D., BCPS, AAHIVP []  Legrand Como, Pharm.D., BCPS, AAHIVP []  Salome Arnt, PharmD, BCPS []  Johnnette Gourd, PharmD, BCPS []  Hughes Better, PharmD, BCPS []  Leeroy Cha, PharmD []  Laqueta Linden, PharmD, BCPS []  Albertina Parr, PharmD  [place] Team []  Leodis Sias, PharmD []  Lindell Spar, PharmD []  Royetta Asal, PharmD []  Graylin Shiver, Rph []  Rema Fendt) Glennon Mac, PharmD []  Arlyn Dunning, PharmD []  Netta Cedars, PharmD []  Dia Sitter, PharmD []  Leone Haven, PharmD []  Gretta Arab, PharmD []  Theodis Shove, PharmD []  Peggyann Juba, PharmD []  Reuel Boom, PharmD   Positive urine culture Treated with cephalexin, organism sensitive to the same and no further patient follow-up is required at this time.  Hazle Nordmann 05/14/2019, 9:31 AM

## 2019-05-18 ENCOUNTER — Telehealth (HOSPITAL_COMMUNITY): Payer: Self-pay

## 2019-05-18 DIAGNOSIS — K4090 Unilateral inguinal hernia, without obstruction or gangrene, not specified as recurrent: Secondary | ICD-10-CM | POA: Diagnosis not present

## 2019-05-18 NOTE — Telephone Encounter (Signed)
Instructions left on the patient's answering machine. Asked to call back with any questions. S.Isella Slatten EMTP 

## 2019-05-23 ENCOUNTER — Ambulatory Visit (HOSPITAL_COMMUNITY): Payer: Medicare Other | Attending: Cardiology

## 2019-05-23 ENCOUNTER — Ambulatory Visit (HOSPITAL_BASED_OUTPATIENT_CLINIC_OR_DEPARTMENT_OTHER): Payer: Medicare Other

## 2019-05-23 ENCOUNTER — Other Ambulatory Visit: Payer: Self-pay

## 2019-05-23 DIAGNOSIS — Z01818 Encounter for other preprocedural examination: Secondary | ICD-10-CM

## 2019-05-23 DIAGNOSIS — I10 Essential (primary) hypertension: Secondary | ICD-10-CM | POA: Diagnosis not present

## 2019-05-23 DIAGNOSIS — R002 Palpitations: Secondary | ICD-10-CM

## 2019-05-23 DIAGNOSIS — Z01810 Encounter for preprocedural cardiovascular examination: Secondary | ICD-10-CM | POA: Diagnosis not present

## 2019-05-23 LAB — MYOCARDIAL PERFUSION IMAGING
LV dias vol: 119 mL (ref 46–106)
LV sys vol: 56 mL
Peak HR: 83 {beats}/min
Rest HR: 66 {beats}/min
SDS: 5
SRS: 0
SSS: 5
TID: 1.16

## 2019-05-23 LAB — ECHOCARDIOGRAM COMPLETE
Height: 70 in
Weight: 3744 oz

## 2019-05-23 MED ORDER — TECHNETIUM TC 99M TETROFOSMIN IV KIT
10.2000 | PACK | Freq: Once | INTRAVENOUS | Status: AC | PRN
  Administered 2019-05-23: 10.2 via INTRAVENOUS
  Filled 2019-05-23: qty 11

## 2019-05-23 MED ORDER — TECHNETIUM TC 99M TETROFOSMIN IV KIT
33.0000 | PACK | Freq: Once | INTRAVENOUS | Status: AC | PRN
  Administered 2019-05-23: 33 via INTRAVENOUS
  Filled 2019-05-23: qty 33

## 2019-05-23 MED ORDER — REGADENOSON 0.4 MG/5ML IV SOLN
0.4000 mg | Freq: Once | INTRAVENOUS | Status: AC
  Administered 2019-05-23: 0.4 mg via INTRAVENOUS

## 2019-05-24 ENCOUNTER — Telehealth: Payer: Self-pay | Admitting: *Deleted

## 2019-05-24 DIAGNOSIS — I340 Nonrheumatic mitral (valve) insufficiency: Secondary | ICD-10-CM

## 2019-05-24 NOTE — Telephone Encounter (Signed)
Overall reassuring echocardiogram with EF 65%. Mild mitral regurgitation. Mildly dilated aortic root of 40 mm. Lets repeat echocardiogram in 1 year to make sure that aortic root remained stable. Continue with current hypertension treatment. ...  Written by Jerline Pain, MD on 05/23/2019 6:37 PM EST View Full Comments  Above order placed for repeat echo 1 in year.

## 2019-05-27 ENCOUNTER — Ambulatory Visit: Payer: Medicare Other | Attending: Internal Medicine

## 2019-05-27 ENCOUNTER — Ambulatory Visit: Payer: Medicare Other

## 2019-05-27 DIAGNOSIS — Z23 Encounter for immunization: Secondary | ICD-10-CM | POA: Insufficient documentation

## 2019-05-27 NOTE — Progress Notes (Signed)
   Covid-19 Vaccination Clinic  Name:  Bonnie Maddox    MRN: [id number] DOB: 11-May-1941  05/27/2019  [name] was observed post Covid-19 immunization for 15 minutes without incidence. She was provided with Vaccine Information Sheet and instruction to access the V-Safe system.   [name] was instructed to call 911 with any severe reactions post vaccine: Marland Kitchen Difficulty breathing  . Swelling of your face and throat  . A fast heartbeat  . A bad rash all over your body  . Dizziness and weakness    Immunizations Administered    Name Date Dose VIS Date Route   Pfizer COVID-19 Vaccine 05/27/2019  2:23 PM 0.3 mL 04/28/2019 Intramuscular   Manufacturer: [employer]   Lot: [id number]   [place]: [id number]

## 2019-06-15 ENCOUNTER — Ambulatory Visit: Payer: Medicare Other

## 2019-06-17 ENCOUNTER — Ambulatory Visit: Payer: Medicare Other | Attending: Internal Medicine

## 2019-06-17 DIAGNOSIS — Z23 Encounter for immunization: Secondary | ICD-10-CM

## 2019-06-17 NOTE — Progress Notes (Signed)
   Covid-19 Vaccination Clinic  Name:  Bonnie Maddox    MRN: [id number] DOB: 18-Oct-1940  06/17/2019  [name] was observed post Covid-19 immunization for 15 minutes without incidence. She was provided with Vaccine Information Sheet and instruction to access the V-Safe system.   [name] was instructed to call 911 with any severe reactions post vaccine: Marland Kitchen Difficulty breathing  . Swelling of your face and throat  . A fast heartbeat  . A bad rash all over your body  . Dizziness and weakness    Immunizations Administered    Name Date Dose VIS Date Route   Pfizer COVID-19 Vaccine 06/17/2019  9:41 AM 0.3 mL 04/28/2019 Intramuscular   Manufacturer: [place]   Lot: [id number]   [place]: [id number]

## 2019-09-04 ENCOUNTER — Ambulatory Visit (INDEPENDENT_AMBULATORY_CARE_PROVIDER_SITE_OTHER): Payer: Medicare Other | Admitting: Family Medicine

## 2019-09-04 ENCOUNTER — Other Ambulatory Visit: Payer: Self-pay

## 2019-09-04 ENCOUNTER — Ambulatory Visit: Payer: Self-pay

## 2019-09-04 ENCOUNTER — Encounter: Payer: Self-pay | Admitting: Family Medicine

## 2019-09-04 DIAGNOSIS — M5441 Lumbago with sciatica, right side: Secondary | ICD-10-CM | POA: Diagnosis not present

## 2019-09-04 DIAGNOSIS — M25551 Pain in right hip: Secondary | ICD-10-CM | POA: Diagnosis not present

## 2019-09-04 MED ORDER — METHYLPREDNISOLONE 4 MG PO TBPK: ORAL_TABLET | ORAL | 0 refills | Status: DC

## 2019-09-04 MED ORDER — BACLOFEN 10 MG PO TABS: 5.0000 mg | ORAL_TABLET | Freq: Every evening | ORAL | 3 refills | Status: DC | PRN

## 2019-09-04 MED ORDER — TRAMADOL HCL 50 MG PO TABS: 50.0000 mg | ORAL_TABLET | Freq: Four times a day (QID) | ORAL | 0 refills | Status: DC | PRN

## 2019-09-04 NOTE — Progress Notes (Signed)
Office Visit Note   Patient: Bonnie Maddox           Date of Birth: [birth year]           MRN: [id number] Visit Date: 09/04/2019 Requested by: Bonnie Rossetti, MD [address] [place],  [place] [address] PCP: Bonnie Rossetti, MD  Subjective: Chief Complaint  Patient presents with  . Right Hip - Pain    Pain in the right buttock - constant. "I can feel it move" when standing up. Painful with sitting/lying down. Been using her cane again since last Wed. Had recent x[facility] at [facility] office. H/o bil hip replacements.     HPI: She is here with right posterior hip pain.  Symptoms started about a week ago, no definite injury.  She started feeling progressively worsening pain from the posterior hip down to the foot.  She gets occasional numbness and tingling.  She is status post bilateral hip replacements 18 years ago with a revision on the right a couple years ago.  She has done pretty well until recently.  Now she feels like her hip is loose when she stands.  This is only recently.  Denies any bowel or bladder dysfunction, fevers or chills.  Denies any rash.  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              ROS:   All other systems were reviewed and are negative.  Objective: Vital Signs: There were no vitals taken for this visit.  Physical Exam:  General:  Alert and oriented, in no acute distress. Pulm:  Breathing unlabored. Psy:  Normal mood, congruent affect.  Low back: She has no significant spinous process tenderness, no tenderness over the SI joint.  She does have pain in the right sciatic notch and that seems to reproduce her pain.  She has no significant pain with passive internal/external right hip rotation.  5/5 lower extremity strength, 2+ knee and ankle DTRs.  Imaging: X-rays lumbar spine: She has slight retrolisthesis of L2 on L3, anterolisthesis of L3 on L4, and of L4 on L5.  There are  degenerative changes throughout.  No sign of compression deformity or neoplasm.  X-rays right hip: Prosthesis looks intact, no sign of loosening.  The femoral component appears centered within the socket.    Assessment & Plan: 1.  Right posterior hip and leg pain, suspicious for lumbar disc protrusion. -Medrol Dosepak, baclofen as needed, tramadol as needed. -Referral to [facility] physical therapy. -If symptoms do not improve, we will either try a one-time epidural injection or proceed with MRI scan lumbar spine first.     Procedures: No procedures performed  No notes on file     PMFS History: Patient Active Problem List   Diagnosis Date Noted  . Hypertension 06/28/2017  . Breast cancer of lower-outer quadrant of right female breast ([place]) 04/06/2014   Past Medical History:  Diagnosis Date  . Arthritis   . Breast cancer ([place])   . Breast cancer of lower-outer quadrant of right female breast ([place]) 04/06/2014  . Hypertension   . Personal history of radiation therapy   . Wears glasses     Family History  Problem Relation Age of Onset  . Breast cancer Sister     Past Surgical History:  Procedure Laterality Date  . APPENDECTOMY    . BREAST BIOPSY    .  BREAST LUMPECTOMY Right    2015  . BREAST LUMPECTOMY WITH NEEDLE LOCALIZATION AND AXILLARY SENTINEL LYMPH NODE BX Right 05/17/2014   Procedure: RIGHT BREAST LUMPECTOMY WITH RADIOACTIVE SEED LOCALIZATION AND SENTINEL NODE MAPPING;  Surgeon: Autumn Messing III, MD;  Location: [place];  Service: General;  Laterality: Right;  . COLONOSCOPY    . TOTAL HIP ARTHROPLASTY  2001,2002   left and right  . TUBAL LIGATION     Social History   Occupational History  . Occupation: Retired  Tobacco Use  . Smoking status: Former Smoker    Packs/day: 1.00    Types: Cigarettes    Quit date: 04/11/1969    Years since quitting: 50.4  . Smokeless tobacco: Never Used  Substance and Sexual Activity  . Alcohol use: Yes     Alcohol/week: 0.0 standard drinks    Comment: 21  oz/week  . Drug use: No  . Sexual activity: Yes

## 2019-09-08 ENCOUNTER — Telehealth: Payer: Self-pay | Admitting: Family Medicine

## 2019-09-08 DIAGNOSIS — M5441 Lumbago with sciatica, right side: Secondary | ICD-10-CM

## 2019-09-08 MED ORDER — HYDROCODONE-ACETAMINOPHEN 5-325 MG PO TABS: 1.0000 | ORAL_TABLET | Freq: Every evening | ORAL | 0 refills | Status: DC | PRN

## 2019-09-08 MED ORDER — MELOXICAM 15 MG PO TABS: 7.5000 mg | ORAL_TABLET | Freq: Every day | ORAL | 6 refills | Status: DC | PRN

## 2019-09-08 MED ORDER — TIZANIDINE HCL 2 MG PO TABS: 2.0000 mg | ORAL_TABLET | Freq: Four times a day (QID) | ORAL | 1 refills | Status: DC | PRN

## 2019-09-08 NOTE — Telephone Encounter (Signed)
New Rx's sent.  Will also get process started for referral to [facility] for ESI.

## 2019-09-08 NOTE — Telephone Encounter (Signed)
Please advise 

## 2019-09-08 NOTE — Telephone Encounter (Signed)
Patient called to let Dr. [name] know that the medication he prescribed for her on Monday is not working for her pinched nerve.  She also wanted to let him know that she can not get into PT until next Thursday.  [id number].  Thank you.

## 2019-09-08 NOTE — Telephone Encounter (Signed)
Advised the patient of the new prescriptions and plan for ESI.

## 2019-09-11 ENCOUNTER — Telehealth: Payer: Self-pay | Admitting: Family Medicine

## 2019-09-11 NOTE — Telephone Encounter (Signed)
Left this message on the patient's home voice mail. It is better to not drive though, if she has taken this, as it could make her drowsy.

## 2019-09-11 NOTE — Telephone Encounter (Signed)
Yes, that's fine 

## 2019-09-11 NOTE — Telephone Encounter (Signed)
Please advise 

## 2019-09-11 NOTE — Telephone Encounter (Signed)
Patient called wanting to know if she can still take the Tramadol if she has pain during the day or does she need to take Aleeve?  CB#[phone].  Thank you

## 2019-09-14 ENCOUNTER — Telehealth: Payer: Self-pay | Admitting: Family Medicine

## 2019-09-14 NOTE — Telephone Encounter (Signed)
Patient called requesting refills for tramadol and hydrocodone. Please send to pharmacy on file. Patient phone number is [phone].

## 2019-09-15 MED ORDER — TRAMADOL HCL 50 MG PO TABS: 50.0000 mg | ORAL_TABLET | Freq: Four times a day (QID) | ORAL | 0 refills | Status: DC | PRN

## 2019-09-15 NOTE — Telephone Encounter (Signed)
Let's stick with tramadol for now.

## 2019-09-15 NOTE — Telephone Encounter (Signed)
Can yates advise for [place]?

## 2019-09-15 NOTE — Telephone Encounter (Signed)
Tramadol called to pharmacy. I called patient and advised tramadol has been sent in, however, we will wait for Dr. [name] to advise on Hydrocodone.  Patient expressed understanding.  Please advise on hydrocodone refill.

## 2019-09-15 NOTE — Telephone Encounter (Signed)
Could you please advise for Dr. [name]?

## 2019-09-15 NOTE — Telephone Encounter (Signed)
I left voicemail for patient advising. 

## 2019-09-15 NOTE — Telephone Encounter (Signed)
OK refill tramadol. Would wait on norco. Thanks

## 2019-09-18 ENCOUNTER — Telehealth: Payer: Self-pay | Admitting: Family Medicine

## 2019-09-18 ENCOUNTER — Other Ambulatory Visit: Payer: Self-pay | Admitting: Family Medicine

## 2019-09-18 DIAGNOSIS — M5441 Lumbago with sciatica, right side: Secondary | ICD-10-CM

## 2019-09-18 MED ORDER — HYDROCODONE-ACETAMINOPHEN 5-325 MG PO TABS: 1.0000 | ORAL_TABLET | Freq: Every evening | ORAL | 0 refills | Status: DC | PRN

## 2019-09-18 NOTE — Telephone Encounter (Signed)
Please advise 

## 2019-09-18 NOTE — Telephone Encounter (Signed)
Sent.  Also, MRI ordered.

## 2019-09-18 NOTE — Telephone Encounter (Signed)
I advised the patient about the refill being sent in and referral for MRI. The patient is ok with this plan.

## 2019-09-18 NOTE — Telephone Encounter (Signed)
Patient called advised she can not sleep and need the Hydrocodone. Patient said she has an appointment with Dr Ernestina Patches May 17th. The number to contact patient is [phone]

## 2019-10-02 ENCOUNTER — Ambulatory Visit (INDEPENDENT_AMBULATORY_CARE_PROVIDER_SITE_OTHER): Payer: Medicare Other | Admitting: Physical Medicine and Rehabilitation

## 2019-10-02 ENCOUNTER — Other Ambulatory Visit: Payer: Self-pay

## 2019-10-02 ENCOUNTER — Encounter: Payer: Self-pay | Admitting: Physical Medicine and Rehabilitation

## 2019-10-02 ENCOUNTER — Ambulatory Visit: Payer: Self-pay

## 2019-10-02 VITALS — BP 177/89 | HR 71 | Ht 70.5 in | Wt 220.0 lb

## 2019-10-02 DIAGNOSIS — M5416 Radiculopathy, lumbar region: Secondary | ICD-10-CM | POA: Diagnosis not present

## 2019-10-02 MED ORDER — METHYLPREDNISOLONE ACETATE 80 MG/ML IJ SUSP
40.0000 mg | Freq: Once | INTRAMUSCULAR | Status: AC
  Administered 2019-10-02: 40 mg

## 2019-10-02 NOTE — Progress Notes (Signed)
Patient presents for lumbar injection. She complains of  pain in her low back that radiates down her right leg to her toes. She is having numbness in both legs and has difficulty walking. When numb, both legs feel weak.  She takes hydrocodone for the pain at night and tramadol, meloxicam during the day with relief.  Numeric Pain Rating Scale and Functional Assessment Average Pain 6/10   In the last MONTH (on 0-10 scale) has pain interfered with the following?  1. General activity like being  able to carry out your everyday physical activities such as walking, climbing stairs, carrying groceries, or moving a chair?  Rating 10/10   +Driver, -BT, -Dye Allergies.

## 2019-10-03 ENCOUNTER — Telehealth: Payer: Self-pay | Admitting: Family Medicine

## 2019-10-03 MED ORDER — MELOXICAM 15 MG PO TABS: 7.5000 mg | ORAL_TABLET | Freq: Every day | ORAL | 6 refills | Status: DC | PRN

## 2019-10-03 MED ORDER — TRAMADOL HCL 50 MG PO TABS: 50.0000 mg | ORAL_TABLET | Freq: Four times a day (QID) | ORAL | 0 refills | Status: DC | PRN

## 2019-10-03 NOTE — Procedures (Signed)
Lumbar Epidural Steroid Injection - Interlaminar Approach with Fluoroscopic Guidance  Patient: Bonnie Maddox      Date of Birth: 11-Jun-1940 MRN: [id number] PCP: Alycia Rossetti, MD      Visit Date: 10/02/2019   Universal Protocol:     Consent Given By: the patient  Position: PRONE  Additional Comments: Vital signs were monitored before and after the procedure. Patient was prepped and draped in the usual sterile fashion. The correct patient, procedure, and site was verified.   Injection Procedure Details:  Procedure Site One Meds Administered:  Meds ordered this encounter  Medications  . methylPREDNISolone acetate (DEPO-MEDROL) injection 40 mg     Laterality: Right  Location/Site:  L5-S1  Needle size: 20 G  Needle type: Tuohy  Needle Placement: Paramedian epidural  Findings:   -Comments: Excellent flow of contrast into the epidural space.  Procedure Details: Using a paramedian approach from the side mentioned above, the region overlying the inferior lamina was localized under fluoroscopic visualization and the soft tissues overlying this structure were infiltrated with 4 ml. of 1% Lidocaine without Epinephrine. The Tuohy needle was inserted into the epidural space using a paramedian approach.   The epidural space was localized using loss of resistance along with lateral and bi-planar fluoroscopic views.  After negative aspirate for air, blood, and CSF, a 2 ml. volume of Isovue-250 was injected into the epidural space and the flow of contrast was observed. Radiographs were obtained for documentation purposes.    The injectate was administered into the level noted above.   Additional Comments:  The patient tolerated the procedure well Dressing: 2 x 2 sterile gauze and Band-Aid    Post-procedure details: Patient was observed during the procedure. Post-procedure instructions were reviewed.  Patient left the clinic in stable condition.

## 2019-10-03 NOTE — Telephone Encounter (Signed)
Sent!

## 2019-10-03 NOTE — Telephone Encounter (Signed)
Patient called.   She needs a refill on Tramadol and Meloxicam.   Call bacl: [phone]

## 2019-10-03 NOTE — Progress Notes (Signed)
Bonnie Maddox - 79 y.o. female MRN [id number]  Date of birth: 05/16/1941  Office Visit Note: Visit Date: 10/02/2019 PCP: Alycia Rossetti, MD Referred by: Alycia Rossetti, MD  Subjective: Chief Complaint  Patient presents with  . Lower Back - Pain   HPI:  Bonnie Maddox is a 79 y.o. female who comes in today At the request of Dr. Eunice Blase for diagnostic and hopefully therapeutic lumbar epidural injection.  She has had lumbar x-ray showing mainly facet arthropathy.  She does have complaints down the right leg sometimes left lower back.  She is having to take hydrocodone at night and tramadol and meloxicam during the day.  She has not had prior lumbar spine injection.  She has had bilateral total hip replacements many years ago.  She is an avid gardener but has decreased her activity to some degree.  Depending on relief consider MRI of the lumbar spine.  ROS Otherwise per HPI.  Assessment & Plan: Visit Diagnoses:  1. Lumbar radiculopathy     Plan: No additional findings.   Meds & Orders:  Meds ordered this encounter  Medications  . methylPREDNISolone acetate (DEPO-MEDROL) injection 40 mg    Orders Placed This Encounter  Procedures  . XR C-ARM NO REPORT  . Epidural Steroid injection    Follow-up: Return for Eunice Blase, MD.   Procedures: No procedures performed  Lumbar Epidural Steroid Injection - Interlaminar Approach with Fluoroscopic Guidance  Patient: Bonnie Maddox      Date of Birth: [birth year] MRN: [id number] PCP: Alycia Rossetti, MD      Visit Date: 10/02/2019   Universal Protocol:     Consent Given By: the patient  Position: PRONE  Additional Comments: Vital signs were monitored before and after the procedure. Patient was prepped and draped in the usual sterile fashion. The correct patient, procedure, and site was verified.   Injection Procedure Details:  Procedure Site One Meds Administered:  Meds ordered this encounter  Medications  .  methylPREDNISolone acetate (DEPO-MEDROL) injection 40 mg     Laterality: Right  Location/Site:  L5-S1  Needle size: 20 G  Needle type: Tuohy  Needle Placement: Paramedian epidural  Findings:   -Comments: Excellent flow of contrast into the epidural space.  Procedure Details: Using a paramedian approach from the side mentioned above, the region overlying the inferior lamina was localized under fluoroscopic visualization and the soft tissues overlying this structure were infiltrated with 4 ml. of 1% Lidocaine without Epinephrine. The Tuohy needle was inserted into the epidural space using a paramedian approach.   The epidural space was localized using loss of resistance along with lateral and bi-planar fluoroscopic views.  After negative aspirate for air, blood, and CSF, a 2 ml. volume of Isovue-250 was injected into the epidural space and the flow of contrast was observed. Radiographs were obtained for documentation purposes.    The injectate was administered into the level noted above.   Additional Comments:  The patient tolerated the procedure well Dressing: 2 x 2 sterile gauze and Band-Aid    Post-procedure details: Patient was observed during the procedure. Post-procedure instructions were reviewed.  Patient left the clinic in stable condition.     Clinical History: No specialty comments available.     Objective:  VS:  HT:5' 10.5" (179.1 cm)   WT:220 lb (99.8 kg)  BMI:31.11    BP:(!) 177/89  HR:71bpm  TEMP: ( )  RESP:  Physical Exam Constitutional:  General: She is not in acute distress.    Appearance: Normal appearance. She is obese. She is not ill-appearing.  HENT:     Head: Normocephalic and atraumatic.     Right Ear: External ear normal.     Left Ear: External ear normal.  Eyes:     Extraocular Movements: Extraocular movements intact.  Cardiovascular:     Rate and Rhythm: Normal rate.     Pulses: Normal pulses.  Musculoskeletal:     Right  lower leg: No edema.     Left lower leg: No edema.     Comments: Patient has good distal strength with no pain over the greater trochanters.  No clonus or focal weakness.  Skin:    Findings: No erythema, lesion or rash.  Neurological:     General: No focal deficit present.     Mental Status: She is alert and oriented to person, place, and time.     Sensory: No sensory deficit.     Motor: No weakness or abnormal muscle tone.     Coordination: Coordination normal.  Psychiatric:        Mood and Affect: Mood normal.        Behavior: Behavior normal.      Imaging: XR C-ARM NO REPORT  Result Date: 10/02/2019 Please see Notes tab for imaging impression.

## 2019-10-03 NOTE — Addendum Note (Signed)
Addended by: Hortencia Pilar on: 10/03/2019 11:29 AM   Modules accepted: Orders

## 2019-10-03 NOTE — Telephone Encounter (Signed)
Please advise 

## 2019-10-03 NOTE — Telephone Encounter (Signed)
Left message on the patient's voice mail that both meds were sent in to her pharmacy.

## 2019-10-09 ENCOUNTER — Telehealth: Payer: Self-pay | Admitting: Physical Medicine and Rehabilitation

## 2019-10-09 DIAGNOSIS — M5416 Radiculopathy, lumbar region: Secondary | ICD-10-CM

## 2019-10-09 NOTE — Telephone Encounter (Signed)
MRI ordered

## 2019-10-09 NOTE — Telephone Encounter (Signed)
I left a voice mail advising the patient that Dr. Ernestina Patches and Dr. [name] agreed that she needs an MRI of the Lsp to know how best to proceed. One was already ordered, and has been scheduled for 10/20/19 at [place].

## 2019-10-09 NOTE — Telephone Encounter (Signed)
Please advise 

## 2019-10-09 NOTE — Telephone Encounter (Signed)
See my note, she needs MRI, Dr. [name] may want to order

## 2019-10-09 NOTE — Telephone Encounter (Signed)
Patient states that the right L5-S1 IL she had on 5/17 has not helped. Please advise.

## 2019-10-10 ENCOUNTER — Telehealth: Payer: Self-pay | Admitting: *Deleted

## 2019-10-10 ENCOUNTER — Telehealth: Payer: Self-pay | Admitting: Family Medicine

## 2019-10-10 MED ORDER — TRAMADOL HCL 50 MG PO TABS: 50.0000 mg | ORAL_TABLET | Freq: Three times a day (TID) | ORAL | 0 refills | Status: DC | PRN

## 2019-10-10 NOTE — Telephone Encounter (Signed)
Sent!

## 2019-10-10 NOTE — Telephone Encounter (Signed)
-----   Message from Marlyne Beards, [place] sent at 10/09/2019  4:28 PM EDT ----- Regarding: MRI Dr. [name] put in an order today for this patient to have an MRI Lsp. She already has one scheduled for 6/04, so please cancel out today's order. Thank you so much!

## 2019-10-10 NOTE — Telephone Encounter (Signed)
Order cancelled as requested 

## 2019-10-10 NOTE — Telephone Encounter (Signed)
Patient called requesting a refill of tramadol. Please send to pharmacy on file. Patient phone number is [phone]

## 2019-10-10 NOTE — Telephone Encounter (Signed)
Left voice mail that her medication was refilled.

## 2019-10-19 ENCOUNTER — Other Ambulatory Visit: Payer: Medicare Other

## 2019-10-20 ENCOUNTER — Telehealth: Payer: Self-pay | Admitting: Family Medicine

## 2019-10-20 ENCOUNTER — Ambulatory Visit
Admission: RE | Admit: 2019-10-20 | Discharge: 2019-10-20 | Disposition: A | Discharge status: Home / Self Care | Payer: Medicare Other | Source: Ambulatory Visit | Attending: Family Medicine | Admitting: Family Medicine

## 2019-10-20 ENCOUNTER — Other Ambulatory Visit: Payer: Self-pay

## 2019-10-20 DIAGNOSIS — M48061 Spinal stenosis, lumbar region without neurogenic claudication: Secondary | ICD-10-CM | POA: Diagnosis not present

## 2019-10-20 DIAGNOSIS — M5441 Lumbago with sciatica, right side: Secondary | ICD-10-CM

## 2019-10-20 MED ORDER — TRAMADOL HCL 50 MG PO TABS: 50.0000 mg | ORAL_TABLET | Freq: Three times a day (TID) | ORAL | 0 refills | Status: DC | PRN

## 2019-10-20 NOTE — Telephone Encounter (Signed)
Sent!

## 2019-10-20 NOTE — Telephone Encounter (Signed)
Please advise 

## 2019-10-20 NOTE — Telephone Encounter (Signed)
Patient called requesting an RX refill on her Tramadol.  Patient uses [facility] in [place].  Patient is also having her MRI done today.  CB#[phone].  Thank you.

## 2019-10-20 NOTE — Telephone Encounter (Signed)
I sent info in a MyChart message to the patient.

## 2019-10-23 ENCOUNTER — Telehealth: Payer: Self-pay | Admitting: Family Medicine

## 2019-10-23 DIAGNOSIS — S32040D Wedge compression fracture of fourth lumbar vertebra, subsequent encounter for fracture with routine healing: Secondary | ICD-10-CM

## 2019-10-23 NOTE — Addendum Note (Signed)
Addended by: Hortencia Pilar on: 10/23/2019 12:58 PM   Modules accepted: Orders

## 2019-10-23 NOTE — Telephone Encounter (Signed)
Lumbar MRI scan shows the following:  There is a subacute compression fracture of the L4 vertebra.  This certainly could be a source of your current pain.  There is severe narrowing of the spinal canal at multiple levels including L2-3, L3-4, and L4-5.  These could also be a significant cause of pain.  Presuming pain is still severe, I recommend consultation with a spine surgeon.

## 2019-10-26 ENCOUNTER — Encounter: Payer: Self-pay | Admitting: Family Medicine

## 2019-10-26 NOTE — Telephone Encounter (Signed)
Still working on the referral, I should be able to get to it today and send it to [facility].

## 2019-10-30 ENCOUNTER — Telehealth: Payer: Self-pay | Admitting: Family Medicine

## 2019-10-30 MED ORDER — TRAMADOL HCL 50 MG PO TABS: 50.0000 mg | ORAL_TABLET | Freq: Three times a day (TID) | ORAL | 0 refills | Status: DC | PRN

## 2019-10-30 NOTE — Telephone Encounter (Signed)
Patient called needing Rx refilled Bonnie Maddox) The number to contact patient is [phone]

## 2019-10-30 NOTE — Telephone Encounter (Signed)
Rx sent 

## 2019-10-30 NOTE — Telephone Encounter (Signed)
Please advise 

## 2019-11-09 ENCOUNTER — Telehealth: Payer: Self-pay | Admitting: Family Medicine

## 2019-11-09 NOTE — Telephone Encounter (Signed)
Patient called needing Rx refilled Tramadol. The number to contact patient is [phone] 

## 2019-11-10 ENCOUNTER — Emergency Department (HOSPITAL_COMMUNITY): Payer: Medicare Other

## 2019-11-10 ENCOUNTER — Other Ambulatory Visit: Payer: Self-pay

## 2019-11-10 ENCOUNTER — Encounter (HOSPITAL_COMMUNITY): Payer: Self-pay

## 2019-11-10 ENCOUNTER — Emergency Department (HOSPITAL_COMMUNITY)
Admission: EM | Admit: 2019-11-10 | Discharge: 2019-11-10 | Disposition: A | Discharge status: Home / Self Care | Payer: Medicare Other | Attending: Emergency Medicine | Admitting: Emergency Medicine

## 2019-11-10 DIAGNOSIS — Y93H2 Activity, gardening and landscaping: Secondary | ICD-10-CM | POA: Diagnosis not present

## 2019-11-10 DIAGNOSIS — T84020A Dislocation of internal right hip prosthesis, initial encounter: Secondary | ICD-10-CM | POA: Diagnosis not present

## 2019-11-10 DIAGNOSIS — Y69 Unspecified misadventure during surgical and medical care: Secondary | ICD-10-CM | POA: Insufficient documentation

## 2019-11-10 DIAGNOSIS — S73004A Unspecified dislocation of right hip, initial encounter: Secondary | ICD-10-CM

## 2019-11-10 DIAGNOSIS — I10 Essential (primary) hypertension: Secondary | ICD-10-CM | POA: Diagnosis not present

## 2019-11-10 DIAGNOSIS — T84020D Dislocation of internal right hip prosthesis, subsequent encounter: Secondary | ICD-10-CM | POA: Diagnosis not present

## 2019-11-10 DIAGNOSIS — Z96643 Presence of artificial hip joint, bilateral: Secondary | ICD-10-CM | POA: Insufficient documentation

## 2019-11-10 DIAGNOSIS — R52 Pain, unspecified: Secondary | ICD-10-CM

## 2019-11-10 MED ORDER — SODIUM CHLORIDE 0.9 % IV BOLUS
1000.0000 mL | Freq: Once | INTRAVENOUS | Status: AC
  Administered 2019-11-10: 1000 mL via INTRAVENOUS

## 2019-11-10 MED ORDER — HYDROMORPHONE HCL 1 MG/ML IJ SOLN
0.5000 mg | Freq: Once | INTRAMUSCULAR | Status: AC
  Administered 2019-11-10: 0.5 mg via INTRAVENOUS
  Filled 2019-11-10: qty 1

## 2019-11-10 MED ORDER — TRAMADOL HCL 50 MG PO TABS: 50.0000 mg | ORAL_TABLET | Freq: Three times a day (TID) | ORAL | 0 refills | Status: DC | PRN

## 2019-11-10 MED ORDER — PROPOFOL 10 MG/ML IV BOLUS
1.0000 mg/kg | Freq: Once | INTRAVENOUS | Status: AC
  Administered 2019-11-10: 98.9 mg via INTRAVENOUS
  Filled 2019-11-10: qty 20

## 2019-11-10 MED ORDER — PROPOFOL 10 MG/ML IV BOLUS
INTRAVENOUS | Status: AC | PRN
  Administered 2019-11-10: 50 mg via INTRAVENOUS
  Administered 2019-11-10: 20 mg via INTRAVENOUS
  Administered 2019-11-10: 50 mg via INTRAVENOUS

## 2019-11-10 NOTE — Telephone Encounter (Signed)
Please advise 

## 2019-11-10 NOTE — Discharge Instructions (Addendum)
You were evaluated in the Emergency Department and after careful evaluation, we did not find any emergent condition requiring admission or further testing in the hospital.  Your exam/testing today is overall reassuring.  Your pain was due to a hip dislocation, which we were able to reduce here in the emergency department.  As discussed, please use Tylenol or Motrin at home for discomfort.  Keep the knee immobilizer on until you are able to follow-up with an orthopedic specialist.  We also advise that you rest and avoid any significant walking or mobility for the next few days.  We have messaged Dr. Eunice Blase, who you have seen in the past and I am hoping that they will reach out to you for follow-up appointment.  If you do not hear from them in 2 or 3 days, please call the [facility] care office number, which is [phone].  You may also arrange follow-up with [facility] if you prefer.  Please return to the Emergency Department if you experience any worsening of your condition.  We encourage you to follow up with a primary care provider.  Thank you for allowing [place] to be a part of your care.

## 2019-11-10 NOTE — ED Notes (Signed)
Pt verbalized understanding of d/c instructions, follow up acre and s/s requiring return to ed. Pt had no further questions and was transported to exit via wheelchair.

## 2019-11-10 NOTE — Telephone Encounter (Signed)
Sent!

## 2019-11-10 NOTE — ED Triage Notes (Signed)
Pt from home via ems; called out for R hip pain; pt working in garden; standing up and kneeling over; pt feels hip replacement might have moved out of place; no fall; hip replacement 79 years old; pain "steady"; worse with movement 6/10; hx back fracture in April; can not bare weight on right side at this time; took tramadol at 2 pm  170/120 HR 74  RR 16 99% RA

## 2019-11-10 NOTE — Telephone Encounter (Signed)
Tried calling the patient to advise - number is not in service. The patient can check with the pharmacy.

## 2019-11-10 NOTE — ED Notes (Signed)
Patient cleaned up and placed back on [place]

## 2019-11-10 NOTE — Sedation Documentation (Signed)
Pt had short episode of apnea. Pt given several breaths with BVM. Spontaneous breathing came back and pt placed back on [place]

## 2019-11-10 NOTE — Progress Notes (Signed)
Orthopedic Tech Progress Note Patient Details:  Bonnie Maddox 1941-01-12 [id number]  Ortho Devices Type of Ortho Device: Knee Immobilizer Ortho Device/Splint Location: Right Lower Extremity Ortho Device/Splint Interventions: Ordered, Application, Adjustment   Post Interventions Patient Tolerated: Well Instructions Provided: Adjustment of device, Care of device, Poper ambulation with device   Patient came in with dislocated hip, was instructed by MD to apply knee immobilizer after reduction.   Bonnie Maddox 11/10/2019, 9:21 PM

## 2019-11-10 NOTE — ED Provider Notes (Signed)
[facility] Emergency Department Provider Note MRN:  [id number]  Arrival date & time: 11/10/19     Chief Complaint   Hip pain History of Present Illness   Bonnie Maddox is a 79 y.o. year-old female with a history of hip replacement presenting to the ED with chief complaint of hip pain.  Patient explains that she was gardening and thinks that she reached too far to the right and then she experienced sudden pain and strain sensation to her right hip.  She had a hip replacement on this side 20 years ago in [place].  She has been unable to walk or bear weight since this event.  She denies any trauma or falls, no head trauma, no loss consciousness, no chest pain or shortness of breath, no abdominal pain, no other complaints.  Review of Systems  A complete 10 system review of systems was obtained and all systems are negative except as noted in the HPI and PMH.   Patient's Health History    Past Medical History:  Diagnosis Date  . Arthritis   . Breast cancer ([place])   . Breast cancer of lower-outer quadrant of right female breast ([place]) 04/06/2014  . Hypertension   . Personal history of radiation therapy   . Wears glasses     Past Surgical History:  Procedure Laterality Date  . APPENDECTOMY    . BREAST BIOPSY    . BREAST LUMPECTOMY Right    2015  . BREAST LUMPECTOMY WITH NEEDLE LOCALIZATION AND AXILLARY SENTINEL LYMPH NODE BX Right 05/17/2014   Procedure: RIGHT BREAST LUMPECTOMY WITH RADIOACTIVE SEED LOCALIZATION AND SENTINEL NODE MAPPING;  Surgeon: Autumn Messing III, MD;  Location: [facility];  Service: General;  Laterality: Right;  . COLONOSCOPY    . TOTAL HIP ARTHROPLASTY  2001,2002   left and right  . TUBAL LIGATION      Family History  Problem Relation Age of Onset  . Breast cancer Sister     Social History   Socioeconomic History  . Marital status: Married    Spouse name: Not on file  . Number of children: 2  . Years of  education: Not on file  . Highest education level: Not on file  Occupational History  . Occupation: Retired  Tobacco Use  . Smoking status: Former Smoker    Packs/day: 1.00    Types: Cigarettes    Quit date: 04/11/1969    Years since quitting: 50.6  . Smokeless tobacco: Never Used  Vaping Use  . Vaping Use: Never used  Substance and Sexual Activity  . Alcohol use: Yes    Alcohol/week: 0.0 standard drinks    Comment: 21  oz/week  . Drug use: No  . Sexual activity: Yes  Other Topics Concern  . Not on file  Social History Narrative  . Not on file   Social Determinants of Health   Financial Resource Strain:   . Difficulty of Paying Living Expenses:   Food Insecurity:   . Worried About Charity fundraiser in the Last Year:   . Arboriculturist in the Last Year:   Transportation Needs:   . Film/video editor (Medical):   Marland Kitchen Lack of Transportation (Non-Medical):   Physical Activity:   . Days of Exercise per Week:   . Minutes of Exercise per Session:   Stress:   . Feeling of Stress :   Social Connections:   . Frequency of Communication with Friends and  Family:   . Frequency of Social Gatherings with Friends and Family:   . Attends Religious Services:   . Active Member of Clubs or Organizations:   . Attends Archivist Meetings:   Marland Kitchen Marital Status:   Intimate Partner Violence:   . Fear of Current or Ex-Partner:   . Emotionally Abused:   Marland Kitchen Physically Abused:   . Sexually Abused:      Physical Exam   Vitals:   11/10/19 2138 11/10/19 2143  BP: (!) 119/98 (!) 159/80  Pulse: 85 70  Resp: 20 18  Temp:    SpO2: 100% 100%    CONSTITUTIONAL: Well-appearing, NAD NEURO:  Alert and oriented x 3, no focal deficits EYES:  eyes equal and reactive ENT/NECK:  no LAD, no JVD CARDIO: Regular rate, well-perfused, normal S1 and S2 PULM:  CTAB no wheezing or rhonchi GI/GU:  normal bowel sounds, non-distended, non-tender MSK/SPINE:  No gross deformities, shortened  right leg, neurovascularly intact, decreased range of motion due to pain SKIN:  no rash, atraumatic PSYCH:  Appropriate speech and behavior  *Additional and/or pertinent findings included in MDM below  Diagnostic and Interventional Summary    EKG Interpretation  Date/Time:    Ventricular Rate:    PR Interval:    QRS Duration:   QT Interval:    QTC Calculation:   R Axis:     Text Interpretation:        Labs Reviewed - No data to display  DG HIP PORT UNILAT WITH PELVIS 1V RIGHT  Final Result    DG Pelvis Portable  Final Result      Medications  HYDROmorphone (DILAUDID) injection 0.5 mg (0.5 mg Intravenous Given 11/10/19 1948)  propofol (DIPRIVAN) 10 mg/mL bolus/IV push 98.9 mg (98.9 mg Intravenous Given 11/10/19 2112)  sodium chloride 0.9 % bolus 1,000 mL (0 mLs Intravenous Stopped 11/10/19 2148)  propofol (DIPRIVAN) 10 mg/mL bolus/IV push (20 mg Intravenous Given 11/10/19 2058)     Procedures  /  Critical Care .Sedation  Date/Time: 11/10/2019 10:24 PM Performed by: Maudie Flakes, MD Authorized by: Maudie Flakes, MD   Consent:    Consent obtained:  Verbal   Consent given by:  Patient   Risks discussed:  Allergic reaction, dysrhythmia, inadequate sedation, nausea, prolonged hypoxia resulting in organ damage, prolonged sedation necessitating reversal, respiratory compromise necessitating ventilatory assistance and intubation and vomiting   Alternatives discussed:  Analgesia without sedation, anxiolysis and regional anesthesia Universal protocol:    Procedure explained and questions answered to patient or proxy's satisfaction: yes     Relevant documents present and verified: yes     Test results available and properly labeled: yes     Imaging studies available: yes     Required blood products, implants, devices, and special equipment available: yes     Site/side marked: yes     Immediately prior to procedure a time out was called: yes     Patient identity  confirmation method:  Verbally with patient Indications:    Procedure performed:  Dislocation reduction   Procedure necessitating sedation performed by:  Physician performing sedation Pre-sedation assessment:    Time since last food or drink:  4 hours   ASA classification: class 1 - normal, healthy patient     Neck mobility: normal     Mouth opening:  3 or more finger widths   Thyromental distance:  4 finger widths   Mallampati score:  I - soft palate, uvula, fauces, pillars visible  Pre-sedation assessments completed and reviewed: airway patency, cardiovascular function, hydration status, mental status, nausea/vomiting, pain level, respiratory function and temperature   Immediate pre-procedure details:    Reassessment: Patient reassessed immediately prior to procedure     Reviewed: vital signs, relevant labs/tests and NPO status     Verified: bag valve mask available, emergency equipment available, intubation equipment available, IV patency confirmed, oxygen available and suction available   Procedure details (see MAR for exact dosages):    Preoxygenation:  Nasal cannula   Sedation:  Propofol   Intended level of sedation: deep   Intra-procedure monitoring:  Blood pressure monitoring, cardiac monitor, continuous pulse oximetry, frequent LOC assessments, frequent vital sign checks and continuous capnometry   Intra-procedure events: none     Intra-procedure management:  Airway repositioning, supplemental oxygen and BVM ventilation   Total Provider sedation time (minutes):  25 Post-procedure details:    Attendance: Constant attendance by certified staff until patient recovered     Recovery: Patient returned to pre-procedure baseline     Post-sedation assessments completed and reviewed: airway patency, cardiovascular function, hydration status, mental status, nausea/vomiting, pain level, respiratory function and temperature     Patient is stable for discharge or admission: yes     Patient  tolerance:  Tolerated well, no immediate complications Comments:     Patient given a slow IV push of 100 mg propofol, which provided near adequate sedation for reduction.  An additional 20 mg was provided.  After successful reduction, patient had a brief apneic spell, during which she had oxygen saturation levels as low as 66%, which quickly improved with supplemental oxygen and about 20 seconds of bag-valve-mask ventilation.  With BVM ventilation her oxygen saturations improved to the 90s and she was easily bagged.  She then quickly regained spontaneous respirations and then regained consciousness and there were no further complications. Reduction of right prosthetic hip dislocation  Date/Time: 11/10/2019 10:26 PM Performed by: Maudie Flakes, MD Authorized by: Maudie Flakes, MD  Local anesthesia used: no  Anesthesia: Local anesthesia used: no  Sedation: Patient sedated: yes Vitals: Vital signs were monitored during sedation.  Patient tolerance: patient tolerated the procedure well with no immediate complications Comments: Successful hip reduction using the Allis maneuver.     ED Course and Medical Decision Making  I have reviewed the triage vital signs, the nursing notes, and pertinent available records from the EMR.  Listed above are laboratory and imaging tests that I personally ordered, reviewed, and interpreted and then considered in my medical decision making (see below for details).      Hip dislocation confirmed on x-ray, no trauma, no other injuries, normal vital signs, neurovascularly intact limb.  Sedation and dislocation reduction as described above.  Neurovascularly intact post procedure with much improved pain.  Patient will follow up with orthopedics and use the knee immobilizer.    Barth Kirks. [place]onia Small, [place] [email or web address]  Final Clinical Impressions(s) / ED Diagnoses     ICD-10-CM   1. Dislocation  of right hip, initial encounter ([place])  S73.004A   2. Pain  R52 DG HIP PORT UNILAT WITH PELVIS 1V RIGHT    DG HIP PORT UNILAT WITH PELVIS 1V RIGHT    ED Discharge Orders    None       Discharge Instructions Discussed with and Provided to Patient:     Discharge Instructions     You were evaluated in the Emergency Department and after careful evaluation,  we did not find any emergent condition requiring admission or further testing in the hospital.  Your exam/testing today is overall reassuring.  Your pain was due to a hip dislocation, which we were able to reduce here in the emergency department.  As discussed, please use Tylenol or Motrin at home for discomfort.  Keep the knee immobilizer on until you are able to follow-up with an orthopedic specialist.  We also advise that you rest and avoid any significant walking or mobility for the next few days.  We have messaged Dr. Eunice Blase, who you have seen in the past and I am hoping that they will reach out to you for follow-up appointment.  If you do not hear from them in 2 or 3 days, please call the [facility] care office number, which is [phone].  You may also arrange follow-up with [facility] if you prefer.  Please return to the Emergency Department if you experience any worsening of your condition.  We encourage you to follow up with a primary care provider.  Thank you for allowing [place] to be a part of your care.      Maudie Flakes, MD 11/10/19 2229

## 2019-11-14 ENCOUNTER — Encounter: Payer: Self-pay | Admitting: Orthopaedic Surgery

## 2019-11-14 ENCOUNTER — Other Ambulatory Visit: Payer: Self-pay

## 2019-11-14 ENCOUNTER — Ambulatory Visit (INDEPENDENT_AMBULATORY_CARE_PROVIDER_SITE_OTHER): Payer: Medicare Other | Admitting: Orthopaedic Surgery

## 2019-11-14 DIAGNOSIS — Z96641 Presence of right artificial hip joint: Secondary | ICD-10-CM

## 2019-11-14 DIAGNOSIS — S73004D Unspecified dislocation of right hip, subsequent encounter: Secondary | ICD-10-CM

## 2019-11-14 NOTE — Progress Notes (Signed)
Office Visit Note   Patient: Bonnie Maddox           Date of Birth: 1941/02/19           MRN: [id number] Visit Date: 11/14/2019              Requested by: Alycia Rossetti, MD [address] [place],  [place] [address] PCP: Alycia Rossetti, MD   Assessment & Plan: Visit Diagnoses:  1. History of total hip arthroplasty, right   2. Hip dislocation, right, subsequent encounter     Plan: As of now, I would like her to adhere to strict posterior hip precautions and she will continue her knee immobilizer.  I would like to see her back in 2 weeks for standing low AP pelvis.  She understands that if this becomes a recurrent issue that we would recommend revision surgery.  All questions and concerns were answered and addressed.  Follow-Up Instructions: Return in about 2 weeks (around 11/28/2019).   Orders:  No orders of the defined types were placed in this encounter.  No orders of the defined types were placed in this encounter.     Procedures: No procedures performed   Clinical Data: No additional findings.   Subjective: Chief Complaint  Patient presents with  . Right Hip - Dislocation  The patient is referred from the emergency room after sustaining a prosthetic hip joint dislocation this past weekend.  Her right hip was replaced 20 years ago as was her left hip.  This was done in [place].  She is never had a dislocation prior to this.  She has been having some hip issues and pain as well as some low back issues.  She was bending over doing some weeding when this happened.  The ER staff was able to reduce her hip and gave her follow-up in our office since she is seeing Dr. [name] before.  She is in a knee immobilizer.  She does understand the need for posterior hip precautions.  HPI  Review of Systems She currently denies any headache, chest pain, shortness of breath, fever, chills, nausea, vomiting  Objective: Vital Signs: There were no vitals taken for this  visit.  Physical Exam She is alert and oriented x3 and in no acute distress Ortho Exam Examination of her right hip shows that is clinically well located.  She is in her knee immobilizer. Specialty Comments:  No specialty comments available.  Imaging: No results found. I did independently review x-rays of her pelvis from earlier this year as well as her dislocation and relocation films.  I do not see any complicating features of the hip itself or the components.  PMFS History: Patient Active Problem List   Diagnosis Date Noted  . Hypertension 06/28/2017  . Breast cancer of lower-outer quadrant of right female breast ([facility]) 04/06/2014   Past Medical History:  Diagnosis Date  . Arthritis   . Breast cancer ([place] [facility])   . Breast cancer of lower-outer quadrant of right female breast ([place]) 04/06/2014  . Hypertension   . Personal history of radiation therapy   . Wears glasses     Family History  Problem Relation Age of Onset  . Breast cancer Sister     Past Surgical History:  Procedure Laterality Date  . APPENDECTOMY    . BREAST BIOPSY    . BREAST LUMPECTOMY Right    2015  . BREAST LUMPECTOMY WITH NEEDLE LOCALIZATION AND AXILLARY SENTINEL LYMPH NODE BX  Right 05/17/2014   Procedure: RIGHT BREAST LUMPECTOMY WITH RADIOACTIVE SEED LOCALIZATION AND SENTINEL NODE MAPPING;  Surgeon: Autumn Messing III, MD;  Location: [facility];  Service: General;  Laterality: Right;  . COLONOSCOPY    . TOTAL HIP ARTHROPLASTY  2001,2002   left and right  . TUBAL LIGATION     Social History   Occupational History  . Occupation: Retired  Tobacco Use  . Smoking status: Former Smoker    Packs/day: 1.00    Types: Cigarettes    Quit date: 04/11/1969    Years since quitting: 50.6  . Smokeless tobacco: Never Used  Vaping Use  . Vaping Use: Never used  Substance and Sexual Activity  . Alcohol use: Yes    Alcohol/week: 0.0 standard drinks    Comment: 21  oz/week  . Drug use: No  . Sexual  activity: Yes

## 2019-11-21 ENCOUNTER — Telehealth: Payer: Self-pay | Admitting: Family Medicine

## 2019-11-21 MED ORDER — TRAMADOL HCL 50 MG PO TABS: 50.0000 mg | ORAL_TABLET | Freq: Three times a day (TID) | ORAL | 0 refills | Status: DC | PRN

## 2019-11-21 NOTE — Telephone Encounter (Signed)
Pt called requesting a refill of her tramadol.   [phone]

## 2019-11-21 NOTE — Telephone Encounter (Signed)
Rx sent 

## 2019-11-21 NOTE — Telephone Encounter (Signed)
Left voice mail advising med sent in to pharmacy.Marland Kitchen

## 2019-11-21 NOTE — Telephone Encounter (Signed)
Dr. Ninfa Linden is now following her for hip dislocation - seeing her again on 11/29/19. Should he refill this or can you refill it?

## 2019-11-29 ENCOUNTER — Encounter: Payer: Self-pay | Admitting: Orthopaedic Surgery

## 2019-11-29 ENCOUNTER — Other Ambulatory Visit: Payer: Self-pay

## 2019-11-29 ENCOUNTER — Ambulatory Visit (INDEPENDENT_AMBULATORY_CARE_PROVIDER_SITE_OTHER): Payer: Medicare Other

## 2019-11-29 ENCOUNTER — Ambulatory Visit (INDEPENDENT_AMBULATORY_CARE_PROVIDER_SITE_OTHER): Payer: Medicare Other | Admitting: Orthopaedic Surgery

## 2019-11-29 DIAGNOSIS — Z96641 Presence of right artificial hip joint: Secondary | ICD-10-CM

## 2019-11-29 DIAGNOSIS — S73004D Unspecified dislocation of right hip, subsequent encounter: Secondary | ICD-10-CM | POA: Diagnosis not present

## 2019-11-29 NOTE — Progress Notes (Signed)
The patient comes in today at least 2 and half weeks after a dislocation of a right hip prosthesis.  She had her hips replaced 20 years ago in [place].  She has never had a dislocation of the right hip until she was doing some yard work and bending way over and the hip dislocated.  The ER staff put it in place.  She followed up with me since I was on call.  She has been in the immobilizer and been strictly adhering to hip precautions.  She is having only minimal pain.  On exam the hip is clinically well located.  X-rays of the pelvis and hip show well-seated total hip arthroplasties with no evidence of loosening of either her right or left hip.  There is slight polywear in terms of the femoral head in the central aspect of the acetabulum.  At this point I want her to try to find out from the hospital special surgery in [place] what the company was used for hips just in case we need to do any type of revision surgery in the future.  She could stop the knee immobilizer.  She is still strictly adhere to hip precautions for the next several months.  I would like to see her back in 3 months to see how she is doing overall but no x-rays are needed.  If she dislocates again, this will need to be once again relocated by the ER staff or in the OR but then we would consider revision surgery at a later point.

## 2019-11-30 ENCOUNTER — Telehealth: Payer: Self-pay | Admitting: Family Medicine

## 2019-11-30 NOTE — Telephone Encounter (Signed)
Patient called.   She needs a refill on her tramadol.   Call back: [phone]

## 2019-12-01 MED ORDER — TRAMADOL HCL 50 MG PO TABS: 50.0000 mg | ORAL_TABLET | Freq: Three times a day (TID) | ORAL | 0 refills | Status: DC | PRN

## 2019-12-01 NOTE — Addendum Note (Signed)
Addended by: Hortencia Pilar on: 12/01/2019 07:48 AM   Modules accepted: Orders

## 2019-12-01 NOTE — Telephone Encounter (Signed)
Rx sent 

## 2019-12-01 NOTE — Telephone Encounter (Signed)
Left voice mail that her med was sent in.

## 2019-12-12 ENCOUNTER — Telehealth: Payer: Self-pay | Admitting: Orthopaedic Surgery

## 2019-12-12 ENCOUNTER — Telehealth: Payer: Self-pay | Admitting: Family Medicine

## 2019-12-12 MED ORDER — TRAMADOL HCL 50 MG PO TABS: 50.0000 mg | ORAL_TABLET | Freq: Three times a day (TID) | ORAL | 0 refills | Status: DC | PRN

## 2019-12-12 NOTE — Telephone Encounter (Signed)
Please advise 

## 2019-12-12 NOTE — Telephone Encounter (Signed)
error 

## 2019-12-12 NOTE — Telephone Encounter (Signed)
Patient called requesting refill of tramadol. Please send to pharmacy on file. Patient phone number is [phone].

## 2019-12-12 NOTE — Telephone Encounter (Signed)
I called and left message on the patient's voice mail that this was done.

## 2019-12-12 NOTE — Telephone Encounter (Signed)
Sent!

## 2019-12-26 ENCOUNTER — Telehealth: Payer: Self-pay | Admitting: Family Medicine

## 2019-12-26 MED ORDER — TRAMADOL HCL 50 MG PO TABS: 50.0000 mg | ORAL_TABLET | Freq: Three times a day (TID) | ORAL | 0 refills | Status: DC | PRN

## 2019-12-26 NOTE — Telephone Encounter (Signed)
Patient called needing Rx refilled Tramadol. The number to contact patient is [phone] 

## 2019-12-26 NOTE — Telephone Encounter (Signed)
Please advise 

## 2019-12-26 NOTE — Telephone Encounter (Signed)
Sent!

## 2019-12-28 ENCOUNTER — Telehealth: Payer: Self-pay

## 2019-12-28 NOTE — Telephone Encounter (Signed)
Per pharmacist she already picked this up

## 2019-12-28 NOTE — Telephone Encounter (Signed)
Patient called in [facility] [place] [facility]

## 2020-01-10 ENCOUNTER — Telehealth: Payer: Self-pay | Admitting: Family Medicine

## 2020-01-10 MED ORDER — TRAMADOL HCL 50 MG PO TABS: 50.0000 mg | ORAL_TABLET | Freq: Three times a day (TID) | ORAL | 0 refills | Status: DC | PRN

## 2020-01-10 NOTE — Telephone Encounter (Signed)
I called and advised the patient - she voiced understanding and will try the taper.

## 2020-01-10 NOTE — Telephone Encounter (Signed)
I called and advised the patient. She said she is down to taking 1 bid for her hip. Her next appointment with Dr. Ninfa Linden for f/u is 02/29/20. She wanted to know if she should taper off of this and switch over to Tylenol --- if so, how would you recommend she do this? Or, should she just continue on it bid prn until that ov with CB? Currently she is moving around with a cane, including in the house. Still having some pain.

## 2020-01-10 NOTE — Telephone Encounter (Signed)
Sent!

## 2020-01-10 NOTE — Telephone Encounter (Signed)
Please advise 

## 2020-01-10 NOTE — Telephone Encounter (Signed)
Pt called requesting a refill of her tramadol please

## 2020-01-10 NOTE — Telephone Encounter (Signed)
It's always good to taper down to the lowest possible dosage.  At current dosage, she wouldn't have any withdrawal effects if she stopped "cold [place]".  But she could do it more slowly by taking 1 in AM and 1/2 in PM for a few days, then 1/2 in AM and 1/2 in PM for a few days, then 1/2 daily for a few days, then stop.    Or something similar.

## 2020-01-23 ENCOUNTER — Telehealth: Payer: Self-pay | Admitting: Physical Medicine and Rehabilitation

## 2020-01-23 NOTE — Telephone Encounter (Signed)
Pt would like to get scheduled for a repeat injection  And would like to know if she needs to get another referral from Dr.Hilts?  [phone]

## 2020-01-24 ENCOUNTER — Telehealth: Payer: Self-pay

## 2020-01-24 NOTE — Telephone Encounter (Signed)
I called the patient: she never had an injection with Dr. Othelia Pulling. He would not give an injection without knowing what Dr. Ernestina Patches had used before. They were going to get the records from here and then call the patient back - she has yet to hear from them. She would like to have the injection here, instead, keeping everything in the [facility] system. The patient said that right now she cannot walk much (recovering from her hip - seeing Dr. Ninfa Linden for this) and cannot drive. After 15 minutes of driving, her leg went numb to the knee. I advised the patient she would be getting a call from Dr. Romona Curls assistant to schedule this repeat ESI.

## 2020-01-24 NOTE — Telephone Encounter (Signed)
Ok

## 2020-01-24 NOTE — Telephone Encounter (Signed)
Looking in chart to see if authorization is needed I noticed a call from shortly after last injection stating that it did not help. MRI was performed, and Dr. [name] referred the patient to Dr. Lynann Bologna. Please advise.

## 2020-01-24 NOTE — Telephone Encounter (Signed)
Right L5-S1 IL on 5/17. Ok to repeat if helped, same problem/side, and no new injury?

## 2020-01-24 NOTE — Telephone Encounter (Signed)
The patient requests a refill on her Tramadol. Please advise.

## 2020-01-25 MED ORDER — TRAMADOL HCL 50 MG PO TABS: 50.0000 mg | ORAL_TABLET | Freq: Three times a day (TID) | ORAL | 0 refills | Status: DC | PRN

## 2020-01-25 NOTE — Telephone Encounter (Signed)
Left message #1 to schedule L4 TF.

## 2020-01-25 NOTE — Telephone Encounter (Signed)
Yes, the [facility] referral (see June 7 note) was for treatment of the compression fracture.  If she doesn't want to pursue that, then another ESI per Dr. Ernestina Patches would be reasonable.

## 2020-01-25 NOTE — Telephone Encounter (Signed)
I called the patient to schedule if criteria for repeat was met. She states that she never had any relief from the injection in May. Please advise.

## 2020-01-25 NOTE — Telephone Encounter (Signed)
Sent!

## 2020-01-25 NOTE — Telephone Encounter (Signed)
I called the patient again to make sure of how she would like to proceed. She said she feels like the stenosis is causing the leg numbness and back pain and therefore, would like to have another ESI with Dr. Ernestina Patches.

## 2020-01-25 NOTE — Telephone Encounter (Signed)
Patient aware.

## 2020-01-25 NOTE — Addendum Note (Signed)
Addended by: Hortencia Pilar on: 01/25/2020 08:05 AM   Modules accepted: Orders

## 2020-01-25 NOTE — Telephone Encounter (Signed)
Please see Dr. Romona Curls note and earlier notes in the message strand. The patient stated yesterday that she did not want to see Dr. Lynann Bologna, that she wanted a 2nd ESI from Dr. Ernestina Patches. Was there a specific reason she was referred to [facility]? I just need to be able to explain to the patient what exactly is going on and why.

## 2020-01-25 NOTE — Telephone Encounter (Signed)
Yes I concur, as usual, with Dr. [name]! If she wants I would L4 trans esi - send to [facility]

## 2020-01-25 NOTE — Telephone Encounter (Signed)
Here is the real story, I did a basic interlaminar esi without MRI, it did not help, Dr. [name] ordered MRI showed L4 compression fracture and edema in pedicles etc. I'm guessing sent to [facility] for possible kyphoplasty. I would be able to do right L4 transforaminal esi. ESI likely not going to help any pain however form the compression fracture.   The whole Dr. Lynann Bologna need my records to decide on injection is red herring or mis-communication I think.

## 2020-01-26 ENCOUNTER — Telehealth: Payer: Self-pay | Admitting: Physical Medicine and Rehabilitation

## 2020-01-26 NOTE — Telephone Encounter (Signed)
Called patient again. No answer and no voicemail after multiple rings.

## 2020-01-26 NOTE — Telephone Encounter (Signed)
Pt calling back from missed call .

## 2020-01-29 ENCOUNTER — Telehealth: Payer: Self-pay | Admitting: Physical Medicine and Rehabilitation

## 2020-01-29 NOTE — Telephone Encounter (Signed)
See previous message

## 2020-01-29 NOTE — Telephone Encounter (Signed)
Pt called stating she was interested in getting an appt with Dr.Newton   [phone]

## 2020-01-29 NOTE — Telephone Encounter (Signed)
Scheduled for 9/27 at 1400 with driver.

## 2020-02-08 ENCOUNTER — Telehealth: Payer: Self-pay

## 2020-02-08 NOTE — Telephone Encounter (Signed)
Patient called she is requesting prescription for tramadol to be refilled call [phone]

## 2020-02-08 NOTE — Telephone Encounter (Signed)
Please advise 

## 2020-02-09 MED ORDER — TRAMADOL HCL 50 MG PO TABS: 50.0000 mg | ORAL_TABLET | Freq: Three times a day (TID) | ORAL | 0 refills | Status: DC | PRN

## 2020-02-09 NOTE — Addendum Note (Signed)
Addended by: Hortencia Pilar on: 02/09/2020 08:01 AM   Modules accepted: Orders

## 2020-02-09 NOTE — Telephone Encounter (Signed)
Sent!

## 2020-02-12 ENCOUNTER — Encounter: Payer: Self-pay | Admitting: Physical Medicine and Rehabilitation

## 2020-02-12 ENCOUNTER — Other Ambulatory Visit: Payer: Self-pay

## 2020-02-12 ENCOUNTER — Ambulatory Visit: Payer: Self-pay

## 2020-02-12 ENCOUNTER — Ambulatory Visit (INDEPENDENT_AMBULATORY_CARE_PROVIDER_SITE_OTHER): Payer: Medicare Other | Admitting: Physical Medicine and Rehabilitation

## 2020-02-12 VITALS — BP 173/89 | HR 61

## 2020-02-12 DIAGNOSIS — M47816 Spondylosis without myelopathy or radiculopathy, lumbar region: Secondary | ICD-10-CM | POA: Diagnosis not present

## 2020-02-12 DIAGNOSIS — M5416 Radiculopathy, lumbar region: Secondary | ICD-10-CM | POA: Diagnosis not present

## 2020-02-12 DIAGNOSIS — S32040D Wedge compression fracture of fourth lumbar vertebra, subsequent encounter for fracture with routine healing: Secondary | ICD-10-CM

## 2020-02-12 DIAGNOSIS — M48062 Spinal stenosis, lumbar region with neurogenic claudication: Secondary | ICD-10-CM

## 2020-02-12 MED ORDER — METHYLPREDNISOLONE ACETATE 80 MG/ML IJ SUSP
80.0000 mg | Freq: Once | INTRAMUSCULAR | Status: AC
  Administered 2020-02-12: 80 mg

## 2020-02-12 NOTE — Procedures (Signed)
Lumbosacral Transforaminal Epidural Steroid Injection - Sub-Pedicular Approach with Fluoroscopic Guidance  Patient: Bonnie Maddox      Date of Birth: 1941/02/19 MRN: [id number] PCP: Alycia Rossetti, MD      Visit Date: 02/12/2020   Universal Protocol:    Date/Time: 02/12/2020  Consent Given By: the patient  Position: PRONE  Additional Comments: Vital signs were monitored before and after the procedure. Patient was prepped and draped in the usual sterile fashion. The correct patient, procedure, and site was verified.   Injection Procedure Details:  Procedure Site One Meds Administered:  Meds ordered this encounter  Medications  . methylPREDNISolone acetate (DEPO-MEDROL) injection 80 mg    Laterality: Bilateral  Location/Site:  L4-L5  Needle size: 22 G  Needle type: Spinal  Needle Placement: Transforaminal  Findings:    -Comments: Excellent flow of contrast along the nerve, nerve root and into the epidural space.  Procedure Details: After squaring off the end-plates to get a true AP view, the C-arm was positioned so that an oblique view of the foramen as noted above was visualized. The target area is just inferior to the "nose of the scotty dog" or sub pedicular. The soft tissues overlying this structure were infiltrated with 2-3 ml. of 1% Lidocaine without Epinephrine.  The spinal needle was inserted toward the target using a "trajectory" view along the fluoroscope beam.  Under AP and lateral visualization, the needle was advanced so it did not puncture dura and was located close the 6 O'Clock position of the pedical in AP tracterory. Biplanar projections were used to confirm position. Aspiration was confirmed to be negative for CSF and/or blood. A 1-2 ml. volume of Isovue-250 was injected and flow of contrast was noted at each level. Radiographs were obtained for documentation purposes.   After attaining the desired flow of contrast documented above, a 0.5 to 1.0 ml  test dose of 0.25% Marcaine was injected into each respective transforaminal space.  The patient was observed for 90 seconds post injection.  After no sensory deficits were reported, and normal lower extremity motor function was noted,   the above injectate was administered so that equal amounts of the injectate were placed at each foramen (level) into the transforaminal epidural space.   Additional Comments:  The patient tolerated the procedure well Dressing: 2 x 2 sterile gauze and Band-Aid    Post-procedure details: Patient was observed during the procedure. Post-procedure instructions were reviewed.  Patient left the clinic in stable condition.

## 2020-02-12 NOTE — Progress Notes (Signed)
Pt state right leg pain that travels up to her lower back.. Pt states walking and standing for a long period time makes the pain worse. Pt state sitting and using a ice pack. Pt has hx of inj on 10/02/19 Pt state that the inj didn't work.  Numeric Pain Rating Scale and Functional Assessment Average Pain 6   In the last MONTH (on 0-10 scale) has pain interfered with the following?  1. General activity like being  able to carry out your everyday physical activities such as walking, climbing stairs, carrying groceries, or moving a chair?  Rating(7)   +Driver, -BT, -Dye Allergies.

## 2020-02-12 NOTE — Progress Notes (Signed)
Bonnie Maddox - 79 y.o. female MRN [id number]  Date of birth: [birth year]  Office Visit Note: Visit Date: 02/12/2020 PCP: Alycia Rossetti, MD Referred by: Alycia Rossetti, MD  Subjective: Chief Complaint  Patient presents with  . Right Leg - Pain  . Lower Back - Pain   HPI: Bonnie Maddox is a 79 y.o. female who comes in today at the request of Dr. Eunice Blase for planned Bilateral L4-L5 Lumbar epidural steroid injection with fluoroscopic guidance.  The patient has failed conservative care including home exercise, medications, time and activity modification.  This injection will be diagnostic and hopefully therapeutic.  Please see requesting physician notes for further details and justification.  I initially saw the patient prior to her MRI findings and we completed interlaminar epidural steroid injection at L5-S1 with relief leading relief if any.  MRI reviewed with images and spine model.  MRI reviewed in the note below.  This showed compression fracture with active edema particularly on the right at L for with multifactorial severe stenosis at L3-4 and L4-5.  She went on to see Dr. Phylliss Bob concerning the compression fracture and at the time recommendation for just injection was stated to the patient.  Unfortunately prior to having any injection by Dr. Normajean Glasgow at their office the patient had dislocation of her prosthetic hip.  She has been suffering from that.  She does have pain more of a radicular nature and claudication nature.  We will complete the L4 transforaminal injections today.  Her compression fracture was small and this is likely healing and will be healed at this point.   ROS Otherwise per HPI.  Assessment & Plan: Visit Diagnoses:  1. Lumbar radiculopathy   2. Spinal stenosis of lumbar region with neurogenic claudication   3. Spondylosis without myelopathy or radiculopathy, lumbar region   4. Closed compression fracture of L4 lumbar vertebra with routine healing,  subsequent encounter     Plan: No additional findings.   Meds & Orders:  Meds ordered this encounter  Medications  . methylPREDNISolone acetate (DEPO-MEDROL) injection 80 mg    Orders Placed This Encounter  Procedures  . XR C-ARM NO REPORT  . Epidural Steroid injection    Follow-up: Return if symptoms worsen or fail to improve.   Procedures: No procedures performed  Lumbosacral Transforaminal Epidural Steroid Injection - Sub-Pedicular Approach with Fluoroscopic Guidance  Patient: Bonnie Maddox      Date of Birth: 08-Feb-1941 MRN: [id number] PCP: Alycia Rossetti, MD      Visit Date: 02/12/2020   Universal Protocol:    Date/Time: 02/12/2020  Consent Given By: the patient  Position: PRONE  Additional Comments: Vital signs were monitored before and after the procedure. Patient was prepped and draped in the usual sterile fashion. The correct patient, procedure, and site was verified.   Injection Procedure Details:  Procedure Site One Meds Administered:  Meds ordered this encounter  Medications  . methylPREDNISolone acetate (DEPO-MEDROL) injection 80 mg    Laterality: Bilateral  Location/Site:  L4-L5  Needle size: 22 G  Needle type: Spinal  Needle Placement: Transforaminal  Findings:    -Comments: Excellent flow of contrast along the nerve, nerve root and into the epidural space.  Procedure Details: After squaring off the end-plates to get a true AP view, the C-arm was positioned so that an oblique view of the foramen as noted above was visualized. The target area is just inferior to the "nose of the scotty dog"  or sub pedicular. The soft tissues overlying this structure were infiltrated with 2-3 ml. of 1% Lidocaine without Epinephrine.  The spinal needle was inserted toward the target using a "trajectory" view along the fluoroscope beam.  Under AP and lateral visualization, the needle was advanced so it did not puncture dura and was located close the 6  O'Clock position of the pedical in AP tracterory. Biplanar projections were used to confirm position. Aspiration was confirmed to be negative for CSF and/or blood. A 1-2 ml. volume of Isovue-250 was injected and flow of contrast was noted at each level. Radiographs were obtained for documentation purposes.   After attaining the desired flow of contrast documented above, a 0.5 to 1.0 ml test dose of 0.25% Marcaine was injected into each respective transforaminal space.  The patient was observed for 90 seconds post injection.  After no sensory deficits were reported, and normal lower extremity motor function was noted,   the above injectate was administered so that equal amounts of the injectate were placed at each foramen (level) into the transforaminal epidural space.   Additional Comments:  The patient tolerated the procedure well Dressing: 2 x 2 sterile gauze and Band-Aid    Post-procedure details: Patient was observed during the procedure. Post-procedure instructions were reviewed.  Patient left the clinic in stable condition.      Clinical History: MRI LUMBAR SPINE WITHOUT CONTRAST   FINDINGS:   Alignment: Lumbar levocurvature. Trace T12-L1 retrolisthesis. L1-L2 and L2-L3 grade 1 retrolisthesis. L3-L4 and L4-L5 grade 1 anterolisthesis.  Vertebrae: Marrow signal is diffusely heterogeneous redemonstrated mild anterior wedge compression fracture deformity of the L4 vertebra. There is prominent edema within the L4 vertebral body and this fracture is likely subacute. Edema extends into the posterior elements at these levels, most notably into the right L4 pedicle. There is a prominent Schmorl node within the L3 superior endplate. Vertebral body height is otherwise maintained. Multilevel degenerative endplate irregularity and mixed degenerative endplate marrow signal. This includes trace degenerative edema along the L3 inferior endplate.  Paraspinal and other soft tissues:  There is edema signal along the spinous processes at L3-L4 which may be related to recent spine injections. Atrophy of the lumbar paraspinal musculature. Incompletely assessed right renal cysts.  Disc levels:   L1-L2: Grade 1 retrolisthesis. Disc bulge. Facet arthrosis/ligamentum flavum hypertrophy. No significant spinal canal stenosis. Mild bilateral neural foraminal narrowing.  L2-L3: Grade 1 retrolisthesis. Disc bulge with endplate spurring. Moderate facet arthrosis with ligamentum flavum hypertrophy. Prominence of the dorsal epidural fat. Moderately severe spinal canal stenosis with left greater than right subarticular stenosis. Moderate right neural foraminal narrowing.  L3-L4: Grade 1 anterolisthesis. Disc bulge. Advanced facet arthrosis with ligamentum flavum hypertrophy. Small right facet joint effusion. Small posteriorly projecting synovial facet cyst. Markedly severe spinal canal stenosis with near complete effacement of the thecal sac and likely cauda equina nerve root impingement. Bilateral neural foraminal narrowing (moderate/severe right, moderate left).  L4-L5: Grade 1 anterolisthesis. Disc bulge asymmetric to the right. Superimposed broad-based central to right extraforaminal disc protrusion. Advanced facet arthrosis with ligamentum flavum hypertrophy. Small right facet joint effusion. Small posteriorly projecting synovial facet cysts. Severe spinal canal stenosis with near complete effacement of the thecal sac. Moderate right neural foraminal narrowing.  L5-S1: Disc bulge with endplate spurring. Superimposed broad-based left foraminal/extraforaminal disc protrusion with associated osteophyte ridge. Moderate facet arthrosis with ligamentum flavum hypertrophy. Mild left subarticular narrowing with medialization of the descending left S1 nerve root. Central canal patent. No significant foraminal stenosis.  These results will be called to the ordering  clinician or representative by the Radiologist Assistant, and communication documented in the PACS or [employer].  IMPRESSION: Mild L4 anterior wedge compression fracture without progressive height loss as compared to lumbar spine radiographs 09/04/2019. There is prominent edema within the L4 vertebra extending into the posterior elements and this fracture is likely subacute.  Lumbar spondylosis as outlined and most notably as follows.  At L3-L4, there is multifactorial markedly severe spinal canal stenosis with likely nerve root impingement. Bilateral neural foraminal narrowing (moderate/severe right, moderate left).  At L4-L5, there is multifactorial severe spinal canal stenosis with near complete effacement of the thecal sac. Moderate right neural foraminal narrowing.  At L2-L3, there is moderately severe spinal canal stenosis with left greater than right subarticular narrowing. Moderate right neural foraminal narrowing.  Multilevel grade 1 spondylolisthesis.  There is edema signal along the spinous processes at L3-L4, which is nonspecific but may reflect sequela of recent spine injections.   Electronically Signed   By: Kellie Simmering DO   On: 10/21/2019 19:03   She reports that she quit smoking about 50 years ago. Her smoking use included cigarettes. She smoked 1.00 pack per day. She has never used smokeless tobacco. No results for input(s): HGBA1C, LABURIC in the last 8760 hours.  Objective:  VS:  HT:    WT:   BMI:     BP:(!) 173/89  HR:61bpm  TEMP: ( )  RESP:  Physical Exam Constitutional:      General: She is not in acute distress.    Appearance: Normal appearance. She is not ill-appearing.  HENT:     Head: Normocephalic and atraumatic.     Right Ear: External ear normal.     Left Ear: External ear normal.  Eyes:     Extraocular Movements: Extraocular movements intact.  Cardiovascular:     Rate and Rhythm: Normal rate.     Pulses: Normal  pulses.  Abdominal:     General: There is no distension.     Palpations: Abdomen is soft.  Musculoskeletal:        General: Tenderness present. No deformity.     Right lower leg: No edema.     Left lower leg: No edema.     Comments: Patient has good distal strength with no pain over the greater trochanters.  No clonus or focal weakness.  She is however sitting in a wheelchair in the office.  She can stand and ambulate.  Skin:    Findings: No erythema, lesion or rash.  Neurological:     General: No focal deficit present.     Mental Status: She is alert and oriented to person, place, and time.     Sensory: No sensory deficit.     Motor: No weakness or abnormal muscle tone.     Coordination: Coordination normal.  Psychiatric:        Mood and Affect: Mood normal.        Behavior: Behavior normal.     Ortho Exam  Imaging: XR C-ARM NO REPORT  Result Date: 02/12/2020 Please see Notes tab for imaging impression.   Past Medical/Family/Surgical/Social History: Medications & Allergies reviewed per EMR, new medications updated. Patient Active Problem List   Diagnosis Date Noted  . Hypertension 06/28/2017  . Breast cancer of lower-outer quadrant of right female breast ([place]) 04/06/2014   Past Medical History:  Diagnosis Date  . Arthritis   . Breast cancer ([place])   . Breast cancer  of lower-outer quadrant of right female breast ([place]) 04/06/2014  . Hypertension   . Personal history of radiation therapy   . Wears glasses    Family History  Problem Relation Age of Onset  . Breast cancer Sister    Past Surgical History:  Procedure Laterality Date  . APPENDECTOMY    . BREAST BIOPSY    . BREAST LUMPECTOMY Right    2015  . BREAST LUMPECTOMY WITH NEEDLE LOCALIZATION AND AXILLARY SENTINEL LYMPH NODE BX Right 05/17/2014   Procedure: RIGHT BREAST LUMPECTOMY WITH RADIOACTIVE SEED LOCALIZATION AND SENTINEL NODE MAPPING;  Surgeon: Autumn Messing III, MD;  Location: [facility];   Service: General;  Laterality: Right;  . COLONOSCOPY    . TOTAL HIP ARTHROPLASTY  2001,2002   left and right  . TUBAL LIGATION     Social History   Occupational History  . Occupation: Retired  Tobacco Use  . Smoking status: Former Smoker    Packs/day: 1.00    Types: Cigarettes    Quit date: 04/11/1969    Years since quitting: 50.8  . Smokeless tobacco: Never Used  Vaping Use  . Vaping Use: Never used  Substance and Sexual Activity  . Alcohol use: Yes    Alcohol/week: 0.0 standard drinks    Comment: 21  oz/week  . Drug use: No  . Sexual activity: Yes

## 2020-02-23 ENCOUNTER — Telehealth: Payer: Self-pay | Admitting: Orthopaedic Surgery

## 2020-02-23 MED ORDER — TRAMADOL HCL 50 MG PO TABS: 50.0000 mg | ORAL_TABLET | Freq: Three times a day (TID) | ORAL | 0 refills | Status: DC | PRN

## 2020-02-23 NOTE — Telephone Encounter (Signed)
Please advise 

## 2020-02-23 NOTE — Telephone Encounter (Signed)
Pt called asking for a refill of tramadol please

## 2020-02-29 ENCOUNTER — Encounter: Payer: Self-pay | Admitting: Orthopaedic Surgery

## 2020-02-29 ENCOUNTER — Ambulatory Visit (INDEPENDENT_AMBULATORY_CARE_PROVIDER_SITE_OTHER): Payer: Medicare Other | Admitting: Orthopaedic Surgery

## 2020-02-29 DIAGNOSIS — S73004D Unspecified dislocation of right hip, subsequent encounter: Secondary | ICD-10-CM | POA: Diagnosis not present

## 2020-02-29 DIAGNOSIS — Z96641 Presence of right artificial hip joint: Secondary | ICD-10-CM

## 2020-02-29 NOTE — Progress Notes (Signed)
The patient is a 78 year old female who is 3 months out from a dislocation of a right total hip arthroplasty.  This was a hip that was replaced in [place] 20 years ago.  She has never had any problems but then she bent way over in the yard working and the hip dislocated.  The ER staff was able to reduce it.  She has been compliant with posterior hip precautions.  She walks the cane.  She has no pain with either hip.  She did bring me previous op reports from those hips when they were done.  Her previous x-rays from postreduction films showed well-seated implants with no complicating features or polyethylene liner wear.  I can easily put both hips the range of motion with no difficulty at all.  She will still adhere to strict posterior hip precautions for at least the next 3 months.  If there is any issues with the hips at all she will let us know.  Follow-up can be as needed.  All questions and concerns were answered and addressed.

## 2020-03-07 ENCOUNTER — Ambulatory Visit: Payer: Medicare Other | Attending: Internal Medicine

## 2020-03-07 DIAGNOSIS — Z23 Encounter for immunization: Secondary | ICD-10-CM

## 2020-03-07 NOTE — Progress Notes (Signed)
   Covid-19 Vaccination Clinic  Name:  DNASIA GAUNA    MRN: [id number] DOB: [birth year]  03/07/2020  Ms. Udovich was observed post Covid-19 immunization for 15 minutes without incident. She was provided with Vaccine Information Sheet and instruction to access the V-Safe system.   Ms. Gladhill was instructed to call 911 with any severe reactions post vaccine: Marland Kitchen Difficulty breathing  . Swelling of face and throat  . A fast heartbeat  . A bad rash all over body  . Dizziness and weakness

## 2020-03-11 ENCOUNTER — Telehealth: Payer: Self-pay

## 2020-03-11 MED ORDER — TRAMADOL HCL 50 MG PO TABS: 50.0000 mg | ORAL_TABLET | Freq: Three times a day (TID) | ORAL | 0 refills | Status: DC | PRN

## 2020-03-11 NOTE — Telephone Encounter (Signed)
Please advise 

## 2020-03-11 NOTE — Telephone Encounter (Signed)
Patient called in for refill on tramdol

## 2020-03-11 NOTE — Telephone Encounter (Signed)
Left this info on the patient's voice mail.

## 2020-03-11 NOTE — Telephone Encounter (Signed)
Sent!

## 2020-03-12 DIAGNOSIS — Z23 Encounter for immunization: Secondary | ICD-10-CM | POA: Diagnosis not present

## 2020-03-14 DIAGNOSIS — M4316 Spondylolisthesis, lumbar region: Secondary | ICD-10-CM | POA: Diagnosis not present

## 2020-03-14 DIAGNOSIS — M48062 Spinal stenosis, lumbar region with neurogenic claudication: Secondary | ICD-10-CM | POA: Diagnosis not present

## 2020-03-14 DIAGNOSIS — M545 Low back pain, unspecified: Secondary | ICD-10-CM | POA: Diagnosis not present

## 2020-03-14 DIAGNOSIS — M5431 Sciatica, right side: Secondary | ICD-10-CM | POA: Diagnosis not present

## 2020-03-14 DIAGNOSIS — M5432 Sciatica, left side: Secondary | ICD-10-CM | POA: Diagnosis not present

## 2020-03-19 ENCOUNTER — Other Ambulatory Visit: Payer: Self-pay | Admitting: Orthopedic Surgery

## 2020-03-19 DIAGNOSIS — M4316 Spondylolisthesis, lumbar region: Secondary | ICD-10-CM

## 2020-03-25 ENCOUNTER — Telehealth: Payer: Self-pay | Admitting: Orthopaedic Surgery

## 2020-03-25 NOTE — Telephone Encounter (Signed)
Patient called. She would like a refill on Tramadol called in. Her call back number is [phone]

## 2020-03-27 ENCOUNTER — Other Ambulatory Visit: Payer: Self-pay | Admitting: Family Medicine

## 2020-04-03 DIAGNOSIS — R1031 Right lower quadrant pain: Secondary | ICD-10-CM | POA: Diagnosis not present

## 2020-04-03 DIAGNOSIS — I10 Essential (primary) hypertension: Secondary | ICD-10-CM | POA: Diagnosis not present

## 2020-04-03 DIAGNOSIS — K4030 Unilateral inguinal hernia, with obstruction, without gangrene, not specified as recurrent: Secondary | ICD-10-CM | POA: Diagnosis not present

## 2020-04-03 DIAGNOSIS — Z853 Personal history of malignant neoplasm of breast: Secondary | ICD-10-CM | POA: Diagnosis not present

## 2020-04-05 ENCOUNTER — Telehealth: Payer: Self-pay | Admitting: Family Medicine

## 2020-04-05 MED ORDER — TRAMADOL HCL 50 MG PO TABS: 50.0000 mg | ORAL_TABLET | Freq: Three times a day (TID) | ORAL | 0 refills | Status: DC | PRN

## 2020-04-05 NOTE — Telephone Encounter (Signed)
Patient called needing Rx refilled Tramadol. The number to contact patient is [phone]

## 2020-04-05 NOTE — Telephone Encounter (Signed)
Sent!

## 2020-04-06 ENCOUNTER — Other Ambulatory Visit: Payer: Self-pay | Admitting: Family Medicine

## 2020-04-09 ENCOUNTER — Telehealth: Payer: Self-pay | Admitting: *Deleted

## 2020-04-09 DIAGNOSIS — K4090 Unilateral inguinal hernia, without obstruction or gangrene, not specified as recurrent: Secondary | ICD-10-CM | POA: Diagnosis not present

## 2020-04-09 DIAGNOSIS — Z01812 Encounter for preprocedural laboratory examination: Secondary | ICD-10-CM | POA: Diagnosis not present

## 2020-04-09 NOTE — Telephone Encounter (Signed)
Received call from patient.   Reports that she is having surgery on 04/15/2020 at [facility]. States that she was to have pre-op appointment today, but it was cancelled as she only requires labs (CBC/ BMP). Inquired as to if PCP can order in [facility] for patient.   Advised that she can have surgeon fax orders to any draw site in [place]. Advised that if she cannot obtain orders, she may need to travel to surgical center for pre-op appointment.

## 2020-04-09 NOTE — Telephone Encounter (Signed)
Agree her surgeon needs to do the labs etc, it is there Pre-op This is not done by our office unless they want [place] to complete forms and she would need OV with me

## 2020-04-10 ENCOUNTER — Ambulatory Visit
Admission: RE | Admit: 2020-04-10 | Discharge: 2020-04-10 | Disposition: A | Discharge status: Home / Self Care | Payer: Medicare Other | Source: Ambulatory Visit | Attending: Orthopedic Surgery | Admitting: Orthopedic Surgery

## 2020-04-10 ENCOUNTER — Other Ambulatory Visit: Payer: Self-pay

## 2020-04-10 DIAGNOSIS — M4316 Spondylolisthesis, lumbar region: Secondary | ICD-10-CM

## 2020-04-10 DIAGNOSIS — M545 Low back pain, unspecified: Secondary | ICD-10-CM | POA: Diagnosis not present

## 2020-04-15 DIAGNOSIS — K4030 Unilateral inguinal hernia, with obstruction, without gangrene, not specified as recurrent: Secondary | ICD-10-CM | POA: Diagnosis not present

## 2020-04-18 DIAGNOSIS — M810 Age-related osteoporosis without current pathological fracture: Secondary | ICD-10-CM | POA: Diagnosis not present

## 2020-04-18 DIAGNOSIS — Z853 Personal history of malignant neoplasm of breast: Secondary | ICD-10-CM | POA: Diagnosis not present

## 2020-04-18 DIAGNOSIS — Z87312 Personal history of (healed) stress fracture: Secondary | ICD-10-CM | POA: Diagnosis not present

## 2020-04-18 DIAGNOSIS — Z780 Asymptomatic menopausal state: Secondary | ICD-10-CM | POA: Diagnosis not present

## 2020-04-18 DIAGNOSIS — Z96643 Presence of artificial hip joint, bilateral: Secondary | ICD-10-CM | POA: Diagnosis not present

## 2020-04-18 DIAGNOSIS — N959 Unspecified menopausal and perimenopausal disorder: Secondary | ICD-10-CM | POA: Diagnosis not present

## 2020-04-23 DIAGNOSIS — M8008XA Age-related osteoporosis with current pathological fracture, vertebra(e), initial encounter for fracture: Secondary | ICD-10-CM | POA: Diagnosis not present

## 2020-04-23 DIAGNOSIS — M4316 Spondylolisthesis, lumbar region: Secondary | ICD-10-CM | POA: Diagnosis not present

## 2020-04-23 DIAGNOSIS — M4726 Other spondylosis with radiculopathy, lumbar region: Secondary | ICD-10-CM | POA: Diagnosis not present

## 2020-04-23 DIAGNOSIS — M48062 Spinal stenosis, lumbar region with neurogenic claudication: Secondary | ICD-10-CM | POA: Diagnosis not present

## 2020-05-07 ENCOUNTER — Telehealth: Payer: Self-pay | Admitting: Family Medicine

## 2020-05-07 MED ORDER — TRAMADOL HCL 50 MG PO TABS: 50.0000 mg | ORAL_TABLET | Freq: Three times a day (TID) | ORAL | 0 refills | Status: DC | PRN

## 2020-05-07 NOTE — Telephone Encounter (Signed)
Done

## 2020-05-07 NOTE — Addendum Note (Signed)
Addended by: Hortencia Pilar on: 05/07/2020 04:33 PM   Modules accepted: Orders

## 2020-05-07 NOTE — Telephone Encounter (Signed)
Please advise. °

## 2020-05-07 NOTE — Telephone Encounter (Signed)
I called and advised the patient this was sent in.

## 2020-05-07 NOTE — Telephone Encounter (Signed)
Pt needs refill on tramadol at [facility]

## 2020-05-14 ENCOUNTER — Telehealth: Payer: Self-pay | Admitting: *Deleted

## 2020-05-14 NOTE — Telephone Encounter (Signed)
Spoke with patient and scheduled her to see Dr Anne Fu on 05/31/2020 at 8:00 AM.

## 2020-05-14 NOTE — Telephone Encounter (Signed)
   Primary Cardiologist: Donato Schultz, MD  Chart reviewed as part of pre-operative protocol coverage. Because of Bonnie Maddox past medical history and time since last visit, she will require a follow-up visit in order to better assess preoperative cardiovascular risk. She was last seen 05/11/19.   She has an echocardiogram for monitoring which has been scheduled for 05/24/2020. Please schedule office visit at least one day after echocardiogram so results can be reviewed during appointment.   Pre-op covering staff: - Please schedule appointment and call patient to inform them. If patient already had an upcoming appointment within acceptable timeframe, please add "pre-op clearance" to the appointment notes so provider is aware. - Please contact requesting surgeon's office via preferred method (i.e, phone, fax) to inform them of need for appointment prior to surgery.  If applicable, this message will also be routed to pharmacy pool and/or primary cardiologist for input on holding anticoagulant/antiplatelet agent as requested below so that this information is available to the clearing provider at time of patient's appointment.   Alver Sorrow, NP  05/14/2020, 12:43 PM

## 2020-05-14 NOTE — Telephone Encounter (Signed)
Scheduled to see Dr. Anne Fu 06/01/19 for preoperative evaluation. Will remove from preop pool as preoperative evaluation will be addressed at upcoming follow up. Will route to Dr. Anne Fu as Lorain Childes. Already routed to requesting office.   Alver Sorrow, NP

## 2020-05-14 NOTE — Telephone Encounter (Signed)
   [facility] Pre-operative Risk Assessment    HEARTCARE STAFF: - Please ensure there is not already an duplicate clearance open for this procedure. - Under Visit Info/Reason for Call, type in Other and utilize the format Clearance MM/DD/YY or Clearance TBD. Do not use dashes or single digits. - If request is for dental extraction, please clarify the # of teeth to be extracted.  Request for surgical clearance:  1. What type of surgery is being performed? L3-5 Fusion   2. When is this surgery scheduled? TBD   3. What type of clearance is required (medical clearance vs. Pharmacy clearance to hold med vs. Both)? Medical  4. Are there any medications that need to be held prior to surgery and how long?None listed   5. Practice name and name of physician performing surgery? [facility], Dr Ivan Croft   6. What is the office phone number? [phone]   7.   What is the office fax number? [phone] Attn:Surgery Dept  8.   Anesthesia type (None, local, MAC, general) ? General   Bonnie Maddox L 05/14/2020, 11:08 AM  _________________________________________________________________   (provider comments below)

## 2020-05-14 NOTE — Telephone Encounter (Signed)
Will also route to requesting surgeons office to make them aware.

## 2020-05-24 ENCOUNTER — Ambulatory Visit (HOSPITAL_COMMUNITY): Payer: Medicare Other | Attending: Cardiovascular Disease

## 2020-05-24 ENCOUNTER — Other Ambulatory Visit: Payer: Self-pay

## 2020-05-24 DIAGNOSIS — I340 Nonrheumatic mitral (valve) insufficiency: Secondary | ICD-10-CM | POA: Diagnosis not present

## 2020-05-24 LAB — ECHOCARDIOGRAM COMPLETE
Area-P 1/2: 4.31 cm2
S' Lateral: 3.2 cm

## 2020-05-27 ENCOUNTER — Telehealth: Payer: Self-pay

## 2020-05-27 MED ORDER — TRAMADOL HCL 50 MG PO TABS: 50.0000 mg | ORAL_TABLET | Freq: Three times a day (TID) | ORAL | 0 refills | Status: DC | PRN

## 2020-05-27 NOTE — Telephone Encounter (Signed)
Patient called she is requesting rx refill for tramadol. CB:[phone]

## 2020-05-27 NOTE — Telephone Encounter (Signed)
Please advise 

## 2020-05-27 NOTE — Addendum Note (Signed)
Addended by: Hortencia Pilar on: 05/27/2020 01:41 PM   Modules accepted: Orders

## 2020-05-27 NOTE — Telephone Encounter (Signed)
Sent!

## 2020-05-29 ENCOUNTER — Other Ambulatory Visit: Payer: Self-pay

## 2020-05-29 ENCOUNTER — Ambulatory Visit (INDEPENDENT_AMBULATORY_CARE_PROVIDER_SITE_OTHER): Payer: Medicare Other | Admitting: Cardiology

## 2020-05-29 ENCOUNTER — Encounter: Payer: Self-pay | Admitting: Cardiology

## 2020-05-29 VITALS — BP 160/100 | HR 62 | Ht 71.0 in | Wt 201.0 lb

## 2020-05-29 DIAGNOSIS — R002 Palpitations: Secondary | ICD-10-CM | POA: Diagnosis not present

## 2020-05-29 DIAGNOSIS — Z01818 Encounter for other preprocedural examination: Secondary | ICD-10-CM

## 2020-05-29 DIAGNOSIS — I340 Nonrheumatic mitral (valve) insufficiency: Secondary | ICD-10-CM

## 2020-05-29 DIAGNOSIS — I10 Essential (primary) hypertension: Secondary | ICD-10-CM | POA: Diagnosis not present

## 2020-05-29 NOTE — Patient Instructions (Signed)
Medication Instructions:  ?The current medical regimen is effective;  continue present plan and medications. ? ?*If you need a refill on your cardiac medications before your next appointment, please call your pharmacy* ? ?Follow-Up: ?At [facility], you and your health needs are our priority.  As part of our continuing mission to provide you with exceptional heart care, we have created designated Provider Care Teams.  These Care Teams include your primary Cardiologist (physician) and Advanced Practice Providers (APPs -  Physician Assistants and Nurse Practitioners) who all work together to provide you with the care you need, when you need it. ? ?We recommend signing up for the patient portal called "MyChart".  Sign up information is provided on this After Visit Summary.  MyChart is used to connect with patients for Virtual Visits (Telemedicine).  Patients are able to view lab/test results, encounter notes, upcoming appointments, etc.  Non-urgent messages can be sent to your provider as well.   ?To learn more about what you can do with MyChart, go to [email or web address].   ? ?Your next appointment:   ?Follow up as needed with Dr Skains ? ?Thank you for choosing [facility]!! ? ? ? ?

## 2020-05-29 NOTE — Progress Notes (Signed)
Cardiology Office Note:    Date:  05/29/2020   ID:  [name], DOB [birth year], MRN [id number]  PCP:  Alycia Rossetti, MD  [facility] Cardiologist:  Candee Furbish, MD  [facility] Electrophysiologist:  None   Referring MD: Alycia Rossetti, MD     History of Present Illness:    Bonnie Maddox is a 80 y.o. female here for follow-up palpitations as well as preoperative risk stratification prior to lumbar spine surgery.    Previously was seen in December 2020 in the emergency department with abdominal pain cramping emesis with run of SVT in the ER.  Felt some slight flutter.  This was diagnosed as atrial tachycardia, I personally reviewed the strips from that day.  In April 2021 she sustained a L3/4 lumbar fracture.  She thinks this occurred after trying to twist her back when pulling garden hoses back into place.  No falls.  Has had radiculopathy especially in her right leg, numbness pins-and-needles and then finally difficulty with proprioception.  She cannot drive with this.  Shots, pain meds. Leg is numb. Right.  Then dislocated hip replacement.  This was fixed.  Finally had hernia repair 6 weeks ago. Went well.   Used to take atenolol for HTN.  She clearly has whitecoat hypertension.  It is always high in the doctor's office now but at home she is 130/70 usually.  Retired Research scientist (life sciences) for years. HR 90's when gardening.   Family with heart Sister, nurse with CAD stent.     Past Medical History:  Diagnosis Date  . Arthritis   . Breast cancer ([place])   . Breast cancer of lower-outer quadrant of right female breast ([place]) 04/06/2014  . Hypertension   . Personal history of radiation therapy   . Wears glasses     Past Surgical History:  Procedure Laterality Date  . APPENDECTOMY    . BREAST BIOPSY    . BREAST LUMPECTOMY Right    2015  . BREAST LUMPECTOMY WITH NEEDLE LOCALIZATION AND AXILLARY SENTINEL LYMPH NODE BX Right 05/17/2014   Procedure: RIGHT BREAST LUMPECTOMY  WITH RADIOACTIVE SEED LOCALIZATION AND SENTINEL NODE MAPPING;  Surgeon: Autumn Messing III, MD;  Location: [facility];  Service: General;  Laterality: Right;  . COLONOSCOPY    . TOTAL HIP ARTHROPLASTY  2001,2002   left and right  . TUBAL LIGATION      Current Medications: Current Meds  Medication Sig  . acetaminophen (TYLENOL) 500 MG tablet Take 1,000 mg by mouth every 6 (six) hours as needed.  . Cyanocobalamin (VITAMIN B 12 PO) Take 1,000 mcg by mouth.  . Magnesium Citrate 200 MG TABS Take 200 mg by mouth daily.   . meloxicam (MOBIC) 15 MG tablet Take 0.5-1 tablets (7.5-15 mg total) by mouth daily as needed for pain.  . Multiple Vitamin (MULTIVITAMIN WITH MINERALS) TABS tablet Take 1 tablet by mouth daily.  . naproxen sodium (ALEVE) 220 MG tablet Take 220 mg by mouth 2 (two) times daily as needed (back pain).  . traMADol (ULTRAM) 50 MG tablet Take 1 tablet (50 mg total) by mouth 3 (three) times daily as needed.     Allergies:   Patient has no known allergies.   Social History   Socioeconomic History  . Marital status: Married    Spouse name: Not on file  . Number of children: 2  . Years of education: Not on file  . Highest education level: Not on file  Occupational History  .  Occupation: Retired  Tobacco Use  . Smoking status: Former Smoker    Packs/day: 1.00    Types: Cigarettes    Quit date: 04/11/1969    Years since quitting: 51.1  . Smokeless tobacco: Never Used  Vaping Use  . Vaping Use: Never used  Substance and Sexual Activity  . Alcohol use: Yes    Alcohol/week: 0.0 standard drinks    Comment: 21  oz/week  . Drug use: No  . Sexual activity: Yes  Other Topics Concern  . Not on file  Social History Narrative  . Not on file   Social Determinants of Health   Financial Resource Strain: Not on file  Food Insecurity: Not on file  Transportation Needs: Not on file  Physical Activity: Not on file  Stress: Not on file  Social Connections: Not on file      Family History: The patient's family history includes Breast cancer in her sister.  ROS:   Please see the history of present illness.    No fevers chills nausea vomiting syncope bleeding all other systems reviewed and are negative.  EKGs/Labs/Other Studies Reviewed:    The following studies were reviewed today: 05/23/2019-nuclear stress test   The left ventricular ejection fraction is mildly decreased (45-54%).  Nuclear stress EF: 53%.  No T wave inversion was noted during stress.  There was no ST segment deviation noted during stress.  Defect 1: There is a medium defect of moderate severity.  This is a low risk study.   Medium size, moderate severity inferoseptal perfusion defect, worse at rest than stress, suggestive of attenuation artifact. Improved somewhat with upright imaging. No significant reversible ischemia. LVEF 53% with normal wall motion. This is a low risk study.  ECHO 05/24/20: 1. Left ventricular ejection fraction, by estimation, is 55 to 60%. The  left ventricle has normal function. The left ventricle has no regional  wall motion abnormalities. There is moderate concentric left ventricular  hypertrophy. Left ventricular  diastolic parameters are consistent with Grade I diastolic dysfunction  (impaired relaxation). Elevated left ventricular end-diastolic pressure.  2. Right ventricular systolic function is normal. The right ventricular  size is normal. There is normal pulmonary artery systolic pressure.  3. Left atrial size was mildly dilated.  4. A small pericardial effusion is present. The pericardial effusion is  anterior to the right ventricle and localized near the right atrium. There  is no evidence of cardiac tamponade.  5. The mitral valve is normal in structure. Mild mitral valve  regurgitation. No evidence of mitral stenosis.  6. The aortic valve is normal in structure. Aortic valve regurgitation is  not visualized. No aortic stenosis is  present.  7. Aortic dilatation noted. There is mild dilatation of the ascending  aorta, measuring 42 mm.  8. The inferior vena cava is normal in size with greater than 50%  respiratory variability, suggesting right atrial pressure of 3 mmHg.   EKG:  EKG is  ordered today.  The ekg ordered today demonstrates SR 62  Recent Labs: No results found for requested labs within last 8760 hours.  Recent Lipid Panel    Component Value Date/Time   CHOL 246 (H) 06/28/2017 1032   TRIG 150 (H) 06/28/2017 1032   HDL 70 06/28/2017 1032   CHOLHDL 3.5 06/28/2017 1032   LDLCALC 148 (H) 06/28/2017 1032     Risk Assessment/Calculations:       Physical Exam:    VS:  BP (!) 160/100 (BP Location: Left Arm, Patient  Position: Sitting, Cuff Size: Normal)   Pulse 62   Ht 5\' 11"  (1.803 m)   Wt 201 lb (91.2 kg)   SpO2 97%   BMI 28.03 kg/m     Wt Readings from Last 3 Encounters:  05/29/20 201 lb (91.2 kg)  11/10/19 218 lb (98.9 kg)  10/02/19 220 lb (99.8 kg)     GEN:  Well nourished, well developed in no acute distress HEENT: Normal NECK: No JVD; No carotid bruits LYMPHATICS: No lymphadenopathy CARDIAC: RRR, no murmurs, rubs, gallops RESPIRATORY:  Clear to auscultation without rales, wheezing or rhonchi  ABDOMEN: Soft, non-tender, non-distended MUSCULOSKELETAL:  No edema; No deformity  SKIN: Warm and dry NEUROLOGIC:  Alert and oriented x 3 PSYCHIATRIC:  Normal affect   ASSESSMENT:    1. Preoperative evaluation to rule out surgical contraindication   2. Essential hypertension   3. Palpitations   4. Nonrheumatic mitral valve regurgitation    PLAN:    In order of problems listed above:  Preoperative cardiac risk stratification for upcoming lumbar spine surgery - Given her recent nuclear stress test last year that was low risk with no significant ischemia and normal ejection fraction or pump function, with no evidence of syncope anginal symptoms or significant palpitations, she may  proceed with low overall cardiac risk for her lumbar spine surgery.  Paroxysmal atrial tachycardia - No further recurrence noted.  No fluttering.  [place] hypertension - Usually high in the doctor's office setting.  At home it is 130/70.  Mild ascending aortic root enlargement - 42 mm, stable.  Consider repeating echocardiogram in 2 to 3 years.  She may come back and see [place] as needed     Medication Adjustments/Labs and Tests Ordered: Current medicines are reviewed at length with the patient today.  Concerns regarding medicines are outlined above.  Orders Placed This Encounter  Procedures  . EKG 12-Lead   No orders of the defined types were placed in this encounter.   Patient Instructions  Medication Instructions:  The current medical regimen is effective;  continue present plan and medications.  *If you need a refill on your cardiac medications before your next appointment, please call your pharmacy*  Follow-Up: At [facility], you and your health needs are our priority.  As part of our continuing mission to provide you with exceptional heart care, we have created designated Provider Care Teams.  These Care Teams include your primary Cardiologist (physician) and Advanced Practice Providers (APPs -  Physician Assistants and Nurse Practitioners) who all work together to provide you with the care you need, when you need it.  We recommend signing up for the patient portal called "MyChart".  Sign up information is provided on this After Visit Summary.  MyChart is used to connect with patients for Virtual Visits (Telemedicine).  Patients are able to view lab/test results, encounter notes, upcoming appointments, etc.  Non-urgent messages can be sent to your provider as well.   To learn more about what you can do with MyChart, go to [email or web address].    Your next appointment:   Follow up as needed with Dr Marlou Porch.   Thank you for choosing [facility]!!         Signed, Candee Furbish, MD  05/29/2020 2:06 PM    [place]

## 2020-05-31 ENCOUNTER — Ambulatory Visit: Payer: Medicare Other | Admitting: Cardiology

## 2020-06-11 ENCOUNTER — Encounter: Payer: Self-pay | Admitting: Family Medicine

## 2020-06-11 ENCOUNTER — Telehealth: Payer: Self-pay | Admitting: Family Medicine

## 2020-06-11 MED ORDER — TRAMADOL HCL 50 MG PO TABS: 50.0000 mg | ORAL_TABLET | Freq: Three times a day (TID) | ORAL | 0 refills | Status: DC | PRN

## 2020-06-11 NOTE — Telephone Encounter (Signed)
I called the patient and advised her the refill has been sent in.  She wanted [place] to know that she is going to have back surgery next Wednesday - a fusion of L3-L4-L5.

## 2020-06-11 NOTE — Telephone Encounter (Signed)
Pt called asking for a refill of her tramadol and she would like to be notified when it's been sent.   [phone]

## 2020-06-11 NOTE — Telephone Encounter (Signed)
Please advise 

## 2020-06-14 DIAGNOSIS — M4726 Other spondylosis with radiculopathy, lumbar region: Secondary | ICD-10-CM | POA: Diagnosis not present

## 2020-06-14 DIAGNOSIS — M48062 Spinal stenosis, lumbar region with neurogenic claudication: Secondary | ICD-10-CM | POA: Diagnosis not present

## 2020-06-14 DIAGNOSIS — M8008XA Age-related osteoporosis with current pathological fracture, vertebra(e), initial encounter for fracture: Secondary | ICD-10-CM | POA: Diagnosis not present

## 2020-06-14 DIAGNOSIS — Z4689 Encounter for fitting and adjustment of other specified devices: Secondary | ICD-10-CM | POA: Diagnosis not present

## 2020-06-14 DIAGNOSIS — M4316 Spondylolisthesis, lumbar region: Secondary | ICD-10-CM | POA: Diagnosis not present

## 2020-06-17 DIAGNOSIS — M4726 Other spondylosis with radiculopathy, lumbar region: Secondary | ICD-10-CM | POA: Diagnosis not present

## 2020-06-17 DIAGNOSIS — M48062 Spinal stenosis, lumbar region with neurogenic claudication: Secondary | ICD-10-CM | POA: Diagnosis not present

## 2020-06-17 DIAGNOSIS — M8008XA Age-related osteoporosis with current pathological fracture, vertebra(e), initial encounter for fracture: Secondary | ICD-10-CM | POA: Diagnosis not present

## 2020-06-17 DIAGNOSIS — Z01812 Encounter for preprocedural laboratory examination: Secondary | ICD-10-CM | POA: Diagnosis not present

## 2020-06-19 DIAGNOSIS — G8918 Other acute postprocedural pain: Secondary | ICD-10-CM | POA: Diagnosis not present

## 2020-06-19 DIAGNOSIS — Z981 Arthrodesis status: Secondary | ICD-10-CM | POA: Diagnosis not present

## 2020-06-19 DIAGNOSIS — M5431 Sciatica, right side: Secondary | ICD-10-CM | POA: Diagnosis not present

## 2020-06-19 DIAGNOSIS — M7138 Other bursal cyst, other site: Secondary | ICD-10-CM | POA: Diagnosis not present

## 2020-06-19 DIAGNOSIS — M5432 Sciatica, left side: Secondary | ICD-10-CM | POA: Diagnosis not present

## 2020-06-19 DIAGNOSIS — M4326 Fusion of spine, lumbar region: Secondary | ICD-10-CM | POA: Diagnosis not present

## 2020-06-19 DIAGNOSIS — M8008XA Age-related osteoporosis with current pathological fracture, vertebra(e), initial encounter for fracture: Secondary | ICD-10-CM | POA: Diagnosis not present

## 2020-06-19 DIAGNOSIS — M48062 Spinal stenosis, lumbar region with neurogenic claudication: Secondary | ICD-10-CM | POA: Diagnosis not present

## 2020-06-19 DIAGNOSIS — M4316 Spondylolisthesis, lumbar region: Secondary | ICD-10-CM | POA: Diagnosis not present

## 2020-06-19 DIAGNOSIS — M4726 Other spondylosis with radiculopathy, lumbar region: Secondary | ICD-10-CM | POA: Diagnosis not present

## 2020-06-20 DIAGNOSIS — M5431 Sciatica, right side: Secondary | ICD-10-CM | POA: Diagnosis not present

## 2020-06-20 DIAGNOSIS — M4326 Fusion of spine, lumbar region: Secondary | ICD-10-CM | POA: Diagnosis not present

## 2020-06-20 DIAGNOSIS — M7138 Other bursal cyst, other site: Secondary | ICD-10-CM | POA: Diagnosis not present

## 2020-06-20 DIAGNOSIS — M4726 Other spondylosis with radiculopathy, lumbar region: Secondary | ICD-10-CM | POA: Diagnosis not present

## 2020-06-20 DIAGNOSIS — Z981 Arthrodesis status: Secondary | ICD-10-CM | POA: Diagnosis not present

## 2020-06-20 DIAGNOSIS — M48062 Spinal stenosis, lumbar region with neurogenic claudication: Secondary | ICD-10-CM | POA: Diagnosis not present

## 2020-06-20 DIAGNOSIS — M5432 Sciatica, left side: Secondary | ICD-10-CM | POA: Diagnosis not present

## 2020-06-20 DIAGNOSIS — M8008XA Age-related osteoporosis with current pathological fracture, vertebra(e), initial encounter for fracture: Secondary | ICD-10-CM | POA: Diagnosis not present

## 2020-07-16 DIAGNOSIS — Z981 Arthrodesis status: Secondary | ICD-10-CM | POA: Diagnosis not present

## 2020-07-16 DIAGNOSIS — M4326 Fusion of spine, lumbar region: Secondary | ICD-10-CM | POA: Diagnosis not present

## 2020-09-16 DIAGNOSIS — H02052 Trichiasis without entropian right lower eyelid: Secondary | ICD-10-CM | POA: Diagnosis not present

## 2020-09-16 DIAGNOSIS — H26491 Other secondary cataract, right eye: Secondary | ICD-10-CM | POA: Diagnosis not present

## 2020-09-20 DIAGNOSIS — M4326 Fusion of spine, lumbar region: Secondary | ICD-10-CM | POA: Diagnosis not present

## 2020-09-20 DIAGNOSIS — M4726 Other spondylosis with radiculopathy, lumbar region: Secondary | ICD-10-CM | POA: Diagnosis not present

## 2020-09-20 DIAGNOSIS — M48062 Spinal stenosis, lumbar region with neurogenic claudication: Secondary | ICD-10-CM | POA: Diagnosis not present

## 2020-09-20 DIAGNOSIS — M4316 Spondylolisthesis, lumbar region: Secondary | ICD-10-CM | POA: Diagnosis not present

## 2020-10-03 DIAGNOSIS — H26491 Other secondary cataract, right eye: Secondary | ICD-10-CM | POA: Diagnosis not present

## 2020-10-07 DIAGNOSIS — Z23 Encounter for immunization: Secondary | ICD-10-CM | POA: Diagnosis not present

## 2020-12-27 DIAGNOSIS — M4326 Fusion of spine, lumbar region: Secondary | ICD-10-CM | POA: Diagnosis not present

## 2020-12-27 DIAGNOSIS — M48062 Spinal stenosis, lumbar region with neurogenic claudication: Secondary | ICD-10-CM | POA: Diagnosis not present

## 2020-12-27 DIAGNOSIS — Z6828 Body mass index (BMI) 28.0-28.9, adult: Secondary | ICD-10-CM | POA: Diagnosis not present

## 2020-12-27 DIAGNOSIS — I10 Essential (primary) hypertension: Secondary | ICD-10-CM | POA: Diagnosis not present

## 2021-02-11 DIAGNOSIS — Z23 Encounter for immunization: Secondary | ICD-10-CM | POA: Diagnosis not present

## 2021-02-28 DIAGNOSIS — Z23 Encounter for immunization: Secondary | ICD-10-CM | POA: Diagnosis not present

## 2021-05-14 ENCOUNTER — Emergency Department (HOSPITAL_BASED_OUTPATIENT_CLINIC_OR_DEPARTMENT_OTHER): Payer: Medicare Other

## 2021-05-14 ENCOUNTER — Encounter (HOSPITAL_BASED_OUTPATIENT_CLINIC_OR_DEPARTMENT_OTHER): Payer: Self-pay | Admitting: *Deleted

## 2021-05-14 ENCOUNTER — Emergency Department (HOSPITAL_BASED_OUTPATIENT_CLINIC_OR_DEPARTMENT_OTHER): Payer: Medicare Other | Admitting: Radiology

## 2021-05-14 ENCOUNTER — Other Ambulatory Visit: Payer: Self-pay

## 2021-05-14 ENCOUNTER — Emergency Department (HOSPITAL_BASED_OUTPATIENT_CLINIC_OR_DEPARTMENT_OTHER)
Admission: EM | Admit: 2021-05-14 | Discharge: 2021-05-14 | Disposition: A | Discharge status: Home / Self Care | Payer: Medicare Other | Attending: Emergency Medicine | Admitting: Emergency Medicine

## 2021-05-14 DIAGNOSIS — I10 Essential (primary) hypertension: Secondary | ICD-10-CM | POA: Insufficient documentation

## 2021-05-14 DIAGNOSIS — M25551 Pain in right hip: Secondary | ICD-10-CM | POA: Insufficient documentation

## 2021-05-14 DIAGNOSIS — X501XXA Overexertion from prolonged static or awkward postures, initial encounter: Secondary | ICD-10-CM | POA: Insufficient documentation

## 2021-05-14 DIAGNOSIS — T84020A Dislocation of internal right hip prosthesis, initial encounter: Secondary | ICD-10-CM | POA: Diagnosis not present

## 2021-05-14 DIAGNOSIS — Z96643 Presence of artificial hip joint, bilateral: Secondary | ICD-10-CM | POA: Insufficient documentation

## 2021-05-14 DIAGNOSIS — Z87891 Personal history of nicotine dependence: Secondary | ICD-10-CM | POA: Diagnosis not present

## 2021-05-14 DIAGNOSIS — Z853 Personal history of malignant neoplasm of breast: Secondary | ICD-10-CM | POA: Diagnosis not present

## 2021-05-14 DIAGNOSIS — Z96641 Presence of right artificial hip joint: Secondary | ICD-10-CM | POA: Diagnosis not present

## 2021-05-14 DIAGNOSIS — Z96649 Presence of unspecified artificial hip joint: Secondary | ICD-10-CM

## 2021-05-14 DIAGNOSIS — S73014A Posterior dislocation of right hip, initial encounter: Secondary | ICD-10-CM | POA: Insufficient documentation

## 2021-05-14 DIAGNOSIS — Z471 Aftercare following joint replacement surgery: Secondary | ICD-10-CM | POA: Diagnosis not present

## 2021-05-14 DIAGNOSIS — S79911A Unspecified injury of right hip, initial encounter: Secondary | ICD-10-CM | POA: Diagnosis present

## 2021-05-14 IMAGING — MR MR LUMBAR SPINE W/O CM
4 of 5 series · 24 of 48 positions shown · non-contrast
Comparison: 10/20/2019 MRI lumbar spine. 09/04/2019 lumbar spine
radiographs.

CLINICAL DATA: Back and bilateral leg pain.

EXAM:
MRI LUMBAR SPINE WITHOUT CONTRAST
TECHNIQUE: Multiplanar, multisequence MR imaging of the lumbar spine was
performed. No intravenous contrast was administered.

[Series 3: T2 post-contrast · sagittal · 4.0mm · 0.59mm/px · 6 of 16 slices shown]
[im 1/16]
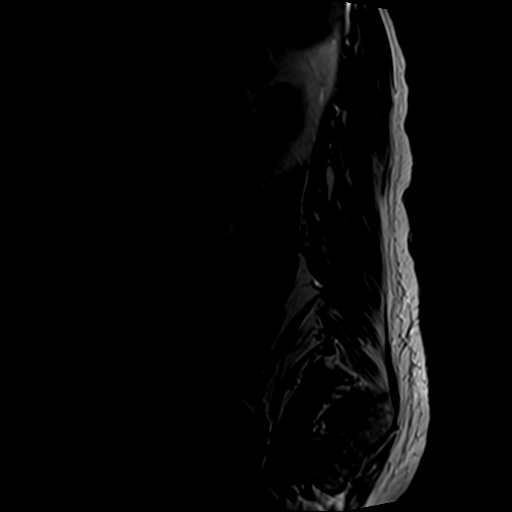
[im 4/16]
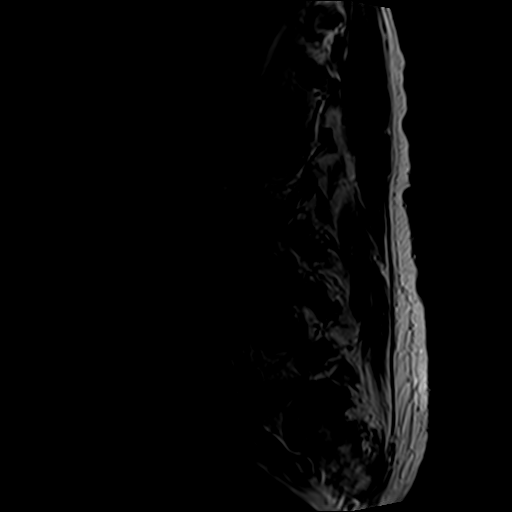
[im 7/16]
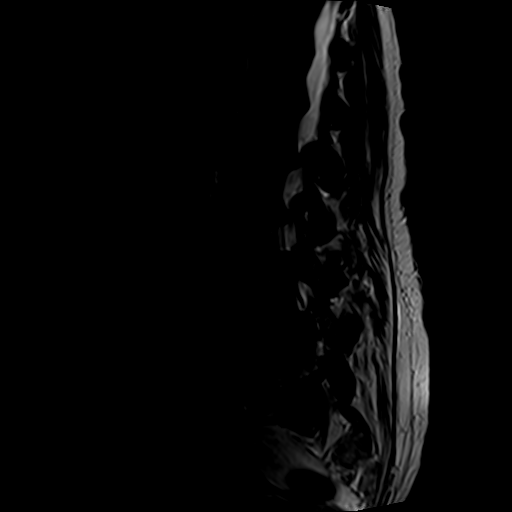
[im 10/16]
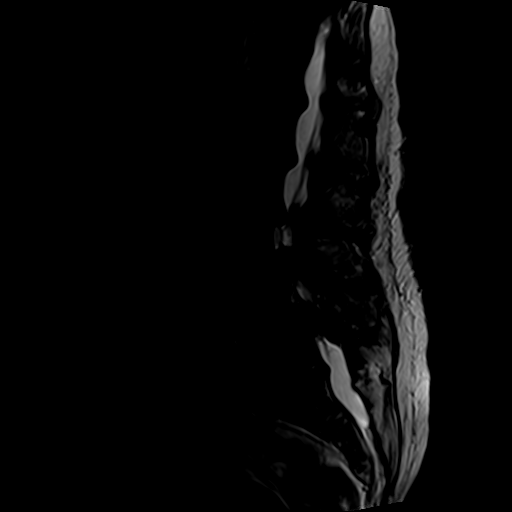
[im 13/16]
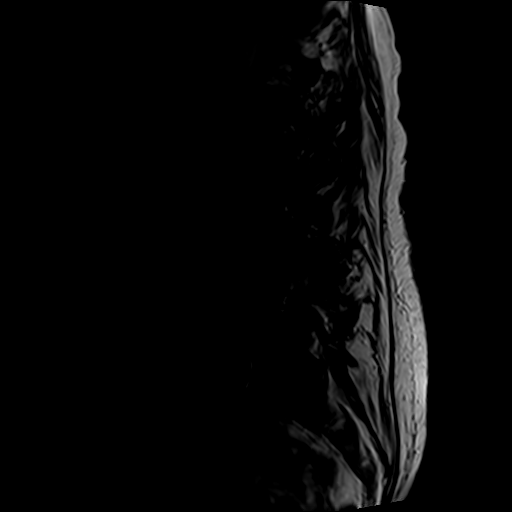
[im 16/16]
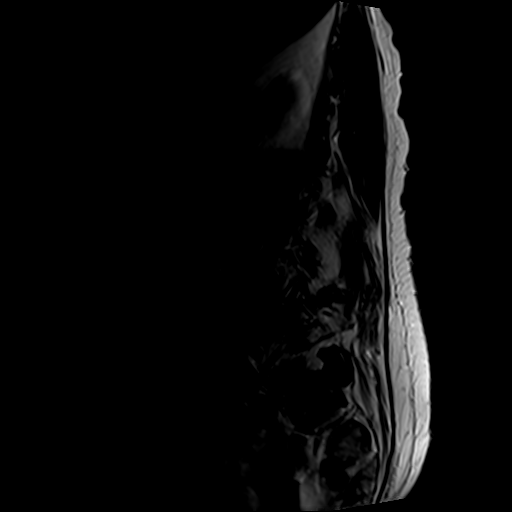

[Series 5: T1 · sagittal · 4.0mm · 0.59mm/px · 6 of 16 slices shown (1 of 2)]
[im 1/16]
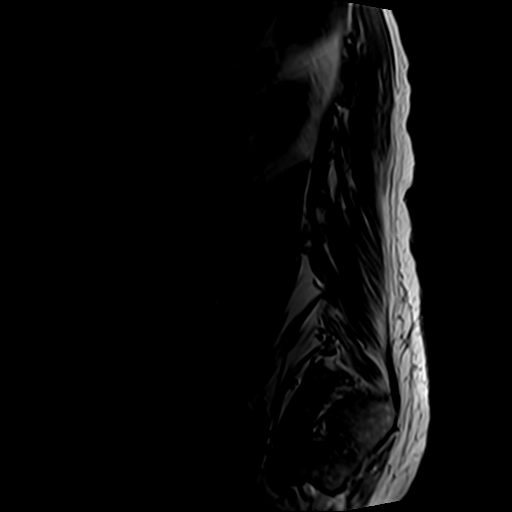
[im 4/16]
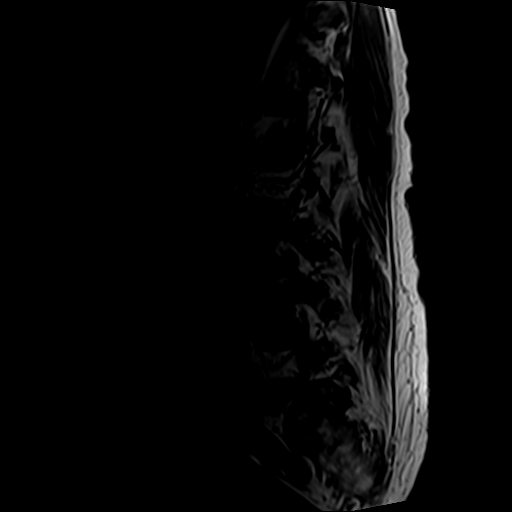
[im 7/16]
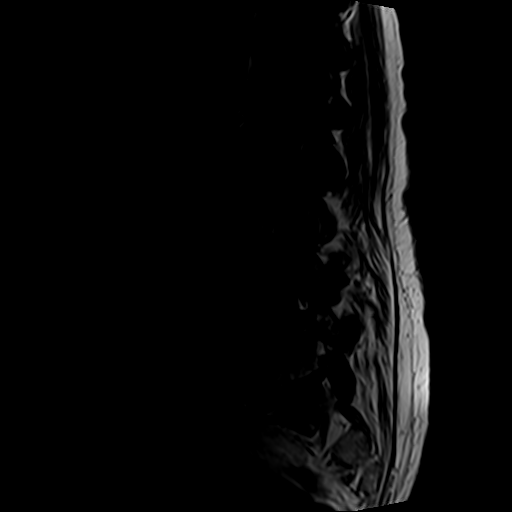
[im 10/16]
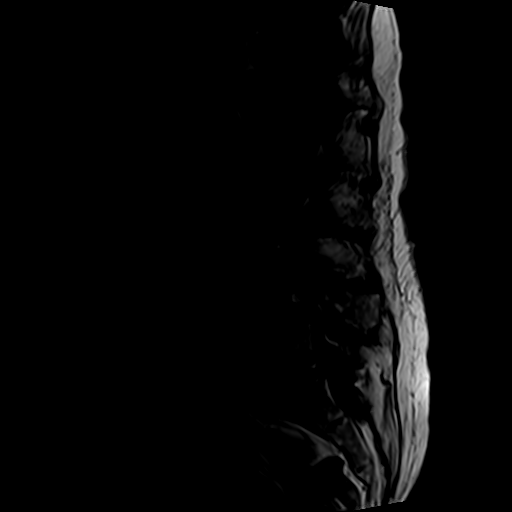
[im 13/16]
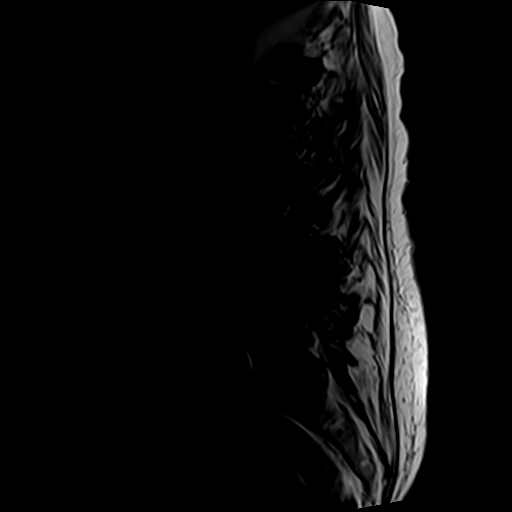
[im 16/16]
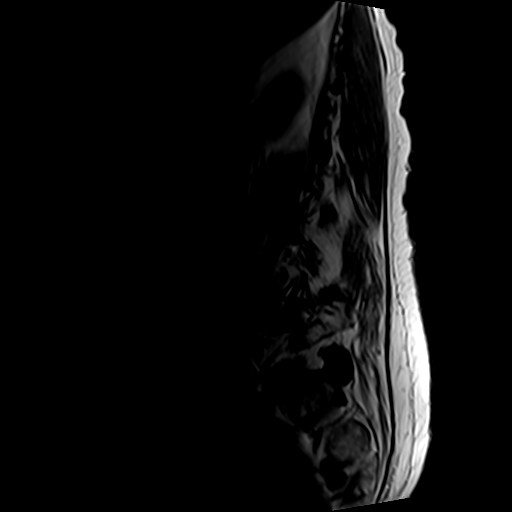

[Series 6: T2 · axial · 4.0mm · 0.70mm/px · z∈[-105,+105]mm · 9 of 39 slices shown]
[im 1/39]
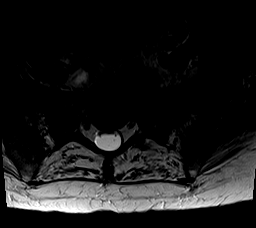
[im 6/39]
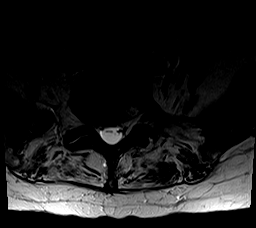
[im 11/39]
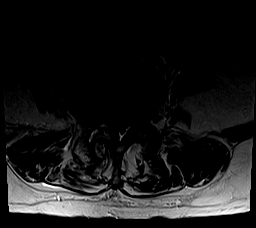
[im 17/39]
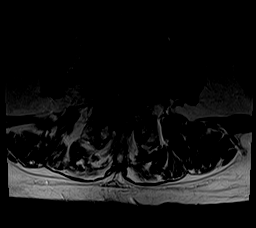
[im 20/39]
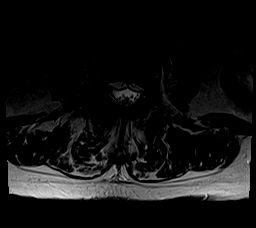
[im 22/39]
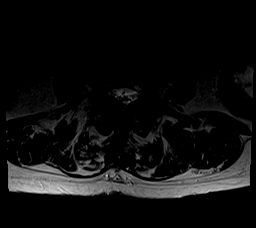
[im 28/39]
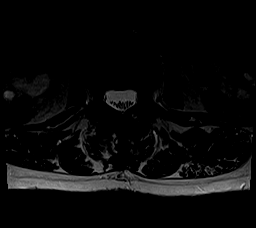
[im 33/39]
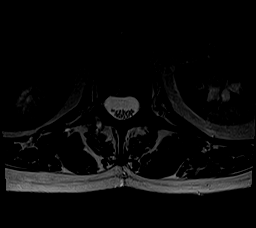
[im 39/39]
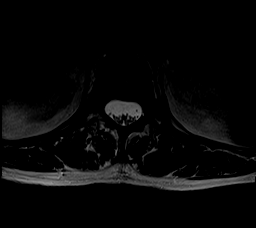

[Series 7: T1 · axial · 4.0mm · 0.35mm/px · z∈[-80,+75]mm · 3 of 39 slices shown (2 of 2)]
[im 6/39]
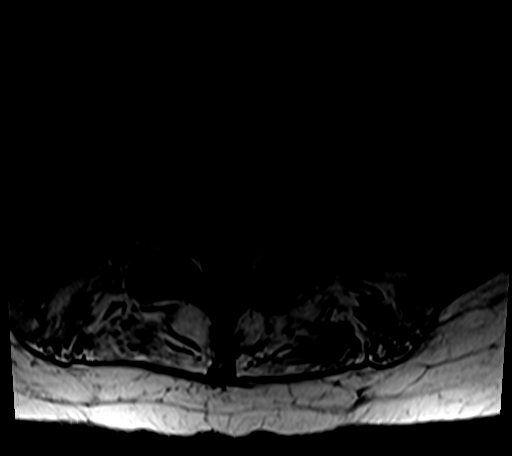
[im 20/39]
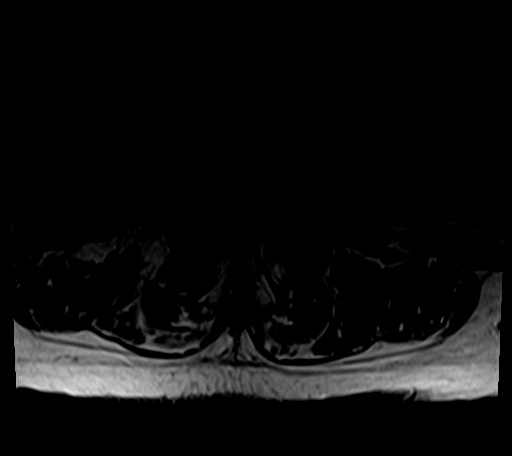
[im 33/39]
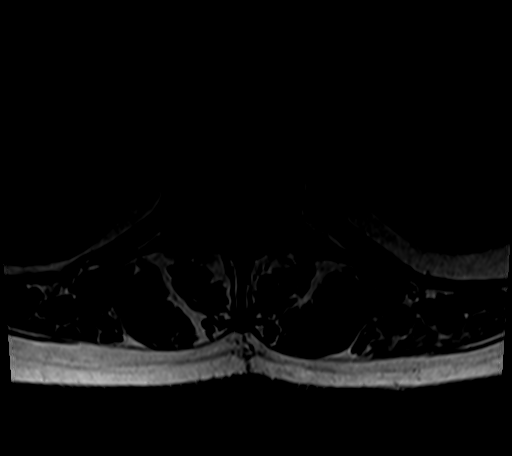

[24 of 48 positions shown; findings below may reference images not displayed]

FINDINGS: Segmentation:  Standard.

Alignment: Stepwise grade 1 retrolisthesis at the T12-L3 levels.
Grade 1 anterolisthesis of 6 mm at the L3-4 level. Grade 1 L4-5
anterolisthesis of 5 mm.

Vertebrae: Multilevel Modic type 2 endplate degenerative changes and
osteophytosis. Prominent Schmorl's node formation involving the
superior L3 endplate is unchanged. Scattered T1/T2 hyperintense foci
reflect hemangiomata versus focal fat.

Bone marrow edema at the L4 level involving the posterior elements
is unchanged however there is slightly increased anterior wedging
when compared to prior exam. Interval increase in bone marrow edema
involving the inferior right L3 vertebral body without height loss
or endplate deformity ([DATE]). No retropulsion.

Conus medullaris and cauda equina: Conus extends to the L1 level.
Conus and cauda equina appear normal.

Disc levels: Multilevel desiccation and disc space loss.

L1-2: Disc bulge, ligamentum flavum thickening and bilateral facet
hypertrophy. Mild spinal canal, mild right and moderate left neural
foraminal narrowing.

L2-3: Disc bulge partially effacing the ventral CSF containing
spaces. Bilateral facet hypertrophy and ligamentum flavum
thickening. Prominent dorsal epidural fat. Moderate to severe spinal
canal and bilateral neural foraminal narrowing is unchanged.

L3-4: Uncovered disc bulge with ligamentum flavum thickening and
bilateral facet hypertrophy. Prominent dorsal epidural fat. Severe
spinal canal, severe right and moderate left neural foraminal
narrowing is grossly unchanged.

L4-5: Right predominant disc bulge with superimposed shallow central
and right extraforaminal protrusions. Bilateral facet hypertrophy
and ligamentum flavum thickening. Severe spinal canal, mild left and
moderate to severe right neural foraminal narrowing, unchanged.

L5-S1: Disc bulge with shallow central protrusion abutting the
descending S1 nerve roots. Bilateral facet hypertrophy. Partial
effacement of the ventral CSF containing spaces. Patent spinal
canal. Mild to moderate bilateral neural foraminal narrowing is
unchanged.

Paraspinal and other soft tissues: Bilateral renal cysts.
IMPRESSION: 1. Increased bone marrow edema involving the inferior right L3
vertebral body without significant height loss or endplate
deformity. This could reflect degenerative related sequela versus
nondisplaced subacute fracture.
2. Slight interval progression of L4 anterior wedging with unchanged
bone marrow edema at this level. No retropulsion
3. Multilevel spondylosis, unchanged since prior exam.
4. Moderate to severe spinal canal narrowing at the L2-3, L3-4 and
L4-5 levels.
5. Moderate to severe left L1-2, bilateral L2-5 neural foraminal
narrowing.
6. Mild to moderate bilateral L5-S1 neural foraminal narrowing.

These results will be called to the ordering clinician or
representative by the Radiologist Assistant, and communication
documented in the PACS or [REDACTED].

## 2021-05-14 MED ORDER — PROPOFOL 10 MG/ML IV BOLUS
INTRAVENOUS | Status: AC | PRN
  Administered 2021-05-14 (×2): 50 mg via INTRAVENOUS
  Administered 2021-05-14: 20 mg via INTRAVENOUS
  Administered 2021-05-14: 30 mg via INTRAVENOUS
  Administered 2021-05-14: 20 mg via INTRAVENOUS

## 2021-05-14 MED ORDER — FENTANYL CITRATE PF 50 MCG/ML IJ SOSY
100.0000 ug | PREFILLED_SYRINGE | Freq: Once | INTRAMUSCULAR | Status: AC
  Administered 2021-05-14: 17:00:00 100 ug via INTRAVENOUS
  Filled 2021-05-14: qty 2

## 2021-05-14 MED ORDER — PROPOFOL 10 MG/ML IV BOLUS
50.0000 mg | Freq: Once | INTRAVENOUS | Status: DC
  Filled 2021-05-14: qty 20

## 2021-05-14 NOTE — ED Triage Notes (Signed)
Pt states she leaned to her side and has dislocated her rt hip, Hip replacement to the hip 21 years ago.Still has a lot of pain, not sure if hi is in place or not.

## 2021-05-14 NOTE — ED Provider Notes (Signed)
[facility] EMERGENCY DEPT Provider Note   CSN: [id number] Arrival date & time: 05/14/21  1534     History Chief Complaint  Patient presents with   Rt hip pain    Bonnie Maddox is a 80 y.o. female.  HPI Patient presents with right hip pain.  States she bent over to the right to pick something up and felt her hip pop out.  Has a previous hip replacement that is around 80 years old.  Dislocated around a year ago.  Had followed up with surgery but no plans for surgery unless it had dislocated again.  No other injury.  Pain is in the right hip.  Worse with movement.  Has not eaten since 11:00 today.  Had recent back surgery and no problems with the anesthesia for that.  Not on blood thinners.    Past Medical History:  Diagnosis Date   Arthritis    Breast cancer ([facility])    Breast cancer of lower-outer quadrant of right female breast ([facility]) 04/06/2014   Hypertension    Personal history of radiation therapy    Wears glasses     Patient Active Problem List   Diagnosis Date Noted   Hypertension 06/28/2017   Breast cancer of lower-outer quadrant of right female breast ([facility]) 04/06/2014    Past Surgical History:  Procedure Laterality Date   APPENDECTOMY     BACK SURGERY     BREAST BIOPSY     BREAST LUMPECTOMY Right    2015   BREAST LUMPECTOMY WITH NEEDLE LOCALIZATION AND AXILLARY SENTINEL LYMPH NODE BX Right 05/17/2014   Procedure: RIGHT BREAST LUMPECTOMY WITH RADIOACTIVE SEED LOCALIZATION AND SENTINEL NODE MAPPING;  Surgeon: Autumn Messing III, MD;  Location: [facility];  Service: General;  Laterality: Right;   COLONOSCOPY     TOTAL HIP ARTHROPLASTY  2001,2002   left and right   TUBAL LIGATION       OB History   No obstetric history on file.     Family History  Problem Relation Age of Onset   Breast cancer Sister     Social History   Tobacco Use   Smoking status: Former    Packs/day: 1.00    Types: Cigarettes    Quit date: 04/11/1969     Years since quitting: 52.1   Smokeless tobacco: Never  Vaping Use   Vaping Use: Never used  Substance Use Topics   Alcohol use: Yes    Alcohol/week: 0.0 standard drinks    Comment: 21  oz/week   Drug use: No    Home Medications Prior to Admission medications   Medication Sig Start Date End Date Taking? Authorizing Provider  acetaminophen (TYLENOL) 500 MG tablet Take 1,000 mg by mouth every 6 (six) hours as needed.    [provider]  Cyanocobalamin (VITAMIN B 12 PO) Take 1,000 mcg by mouth.    [provider]  Magnesium Citrate 200 MG TABS Take 200 mg by mouth daily.     [provider]  meloxicam (MOBIC) 15 MG tablet Take 0.5-1 tablets (7.5-15 mg total) by mouth daily as needed for pain. 10/03/19   Hilts, Legrand Como, MD  Multiple Vitamin (MULTIVITAMIN WITH MINERALS) TABS tablet Take 1 tablet by mouth daily.    [provider]  naproxen sodium (ALEVE) 220 MG tablet Take 220 mg by mouth 2 (two) times daily as needed (back pain).    [provider]  traMADol (ULTRAM) 50 MG tablet Take 1 tablet (50 mg  total) by mouth 3 (three) times daily as needed. 06/11/20   Hilts, Legrand Como, MD    Allergies    Patient has no known allergies.  Review of Systems   Review of Systems  Constitutional:  Negative for appetite change.  Respiratory:  Negative for shortness of breath.   Cardiovascular:  Negative for chest pain.  Gastrointestinal:  Negative for abdominal pain.  Musculoskeletal:  Negative for back pain.       Right hip pain.  Neurological:  Negative for headaches.  Psychiatric/Behavioral:  Negative for confusion.    Physical Exam Updated Vital Signs BP (!) 172/86    Pulse 73    Temp 98.3 F (36.8 C)    Resp 16    Ht 5' 9.5" (1.765 m)    Wt 98 kg    SpO2 100%    BMI 31.44 kg/m   Physical Exam Vitals and nursing note reviewed.  HENT:     Head: Atraumatic.  Cardiovascular:     Rate and Rhythm: Regular rhythm.  Chest:     Chest wall: No  tenderness.  Abdominal:     Tenderness: There is no abdominal tenderness.  Musculoskeletal:        General: Tenderness present.     Cervical back: Neck supple.     Comments: Tenderness to right hip laterally.  Upper leg held flexed.  Neurovascular intact in right foot.  Skin:    General: Skin is warm.     Capillary Refill: Capillary refill takes less than 2 seconds.  Neurological:     Mental Status: She is alert and oriented to person, place, and time.    ED Results / Procedures / Treatments   Labs (all labs ordered are listed, but only abnormal results are displayed) Labs Reviewed - No data to display  EKG None  Radiology DG Hip Unilat  With Pelvis 2-3 Views Right  Result Date: 05/14/2021 CLINICAL DATA:  Question of dislocated RIGHT hip, post hip replacement 21 years ago. EXAM: DG HIP (WITH OR WITHOUT PELVIS) 2-3V RIGHT COMPARISON:  Comparison March 14, 2020 lumbar spine evaluation and prior lumbar spine evaluations, no recent hip comparison, most recent hip comparison from November 10, 2019. FINDINGS: Signs of bilateral total hip arthroplasty. No sign of fracture. The RIGHT hip prosthesis is dislocated posteriorly and superiorly with significant overlap upon the posterior margin of the acetabular component. There is no associated fracture as outlined above. The pelvis is intact.  Degenerative changes are noted in the spine. IMPRESSION: Posterior superior dislocation of the femoral component of the RIGHT hip prosthesis. No visible fracture. Signs of LEFT hip arthroplasty and spinal fusion. These areas are incompletely assessed. Electronically Signed   By: Zetta Bills M.D.   On: 05/14/2021 16:22    Procedures .Ortho Injury Treatment  Date/Time: 05/14/2021 5:20 PM Performed by: Davonna Belling, MD Authorized by: Davonna Belling, MD   Consent:    Consent obtained:  Verbal and written   Consent given by:  Patient   Risks discussed:  Fracture, restricted joint movement,  vascular damage, stiffness, recurrent dislocation, nerve damage and irreducible dislocation   Alternatives discussed:  No treatment, alternative treatment, immobilization, referral and delayed treatment Universal protocol:    Immediately prior to procedure a time out was called: yes     Patient identity confirmed:  Arm band and verbally with patientInjury location: hip Location details: right hip Injury type: dislocation Dislocation type: posterior Spontaneous dislocation: yes Prosthesis: yes Pre-procedure neurovascular assessment: neurovascularly intact Pre-procedure distal perfusion:  normal Pre-procedure neurological function: normal Pre-procedure range of motion: reduced  Anesthesia: Local anesthesia used: no  Patient sedated: Yes. Refer to sedation procedure documentation for details of sedation. Manipulation performed: yes Reduction method: Several different maneuvers tried with eventual reduction of dislocation. Reduction successful: yes X-ray confirmed reduction: yes Immobilization: brace Splint Applied by: ED Tech Post-procedure neurovascular assessment: post-procedure neurovascularly intact Post-procedure distal perfusion: normal Post-procedure neurological function: normal Post-procedure range of motion: improved   .Sedation  Date/Time: 05/14/2021 5:20 PM Performed by: Davonna Belling, MD Authorized by: Davonna Belling, MD   Consent:    Consent obtained:  Verbal   Consent given by:  Patient   Risks discussed:  Allergic reaction, dysrhythmia, nausea, inadequate sedation, prolonged hypoxia resulting in organ damage, respiratory compromise necessitating ventilatory assistance and intubation and vomiting   Alternatives discussed:  Analgesia without sedation Universal protocol:    Immediately prior to procedure, a time out was called: yes     Patient identity confirmed:  Arm band and verbally with patient Indications:    Procedure performed:  Dislocation  reduction   Procedure necessitating sedation performed by:  Physician performing sedation Pre-sedation assessment:    Time since last food or drink:  7 hrs   ASA classification: class 2 - patient with mild systemic disease     Mouth opening:  2 finger widths   Thyromental distance:  4 finger widths   Mallampati score:  I - soft palate, uvula, fauces, pillars visible   Neck mobility: normal     Pre-sedation assessments completed and reviewed: airway patency, cardiovascular function, hydration status, mental status, pain level, respiratory function and temperature     Pre-sedation assessments completed and reviewed: pre-procedure nausea and vomiting status not reviewed   Immediate pre-procedure details:    Reassessment: Patient reassessed immediately prior to procedure     Reviewed: vital signs, relevant labs/tests and NPO status     Verified: bag valve mask available, emergency equipment available, intubation equipment available, IV patency confirmed, oxygen available and suction available   Procedure details (see MAR for exact dosages):    Preoxygenation:  Nasal cannula   Sedation:  Propofol   Intended level of sedation: deep   Analgesia:  Fentanyl   Intra-procedure monitoring:  Blood pressure monitoring, cardiac monitor, continuous pulse oximetry, continuous capnometry, frequent LOC assessments and frequent vital sign checks   Intra-procedure events: respiratory depression     Intra-procedure management:  BVM ventilation   Total Provider sedation time (minutes):  15 Post-procedure details:    Attendance: Constant attendance by certified staff until patient recovered     Recovery: Patient returned to pre-procedure baseline     Post-sedation assessments completed and reviewed: airway patency, cardiovascular function, hydration status, mental status, pain level, respiratory function and temperature     Post-sedation assessments completed and reviewed: post-procedure nausea and vomiting  status not reviewed     Patient is stable for discharge or admission: yes     Procedure completion:  Tolerated well, no immediate complications Comments:     Required recurrent doses of propofol for enough sedation.  Somewhat difficult reduction.  Did have his brief episode of respiratory suppression or bag-valve-mask was used.  Recovered without incident.   Medications Ordered in ED Medications  fentaNYL (SUBLIMAZE) injection 100 mcg (has no administration in time range)  propofol (DIPRIVAN) 10 mg/mL bolus/IV push 50 mg (has no administration in time range)    ED Course  I have reviewed the triage vital signs and the nursing notes.  Pertinent labs & imaging results that were available during my care of the patient were reviewed by me and considered in my medical decision making (see chart for details).    MDM Rules/Calculators/A&P                         Patient with recurrent right prosthetic hip dislocation.  Last 1 was around a year ago.  States she just bent over wrong.  Has seen Dr. Ninfa Linden in the past with plans of potential surgery if dislocation recurred.  Reviewed previous sedation records and sedated patient with successful reduction.  Awake and appropriate.  Knee immobilizer given we will follow-up with orthopedic surgery.  Back at baseline after procedure    Final Clinical Impression(s) / ED Diagnoses Final diagnoses:  None    Rx / DC Orders ED Discharge Orders     None        Davonna Belling, MD 05/14/21 1904

## 2021-05-21 ENCOUNTER — Encounter: Payer: Self-pay | Admitting: Orthopaedic Surgery

## 2021-05-21 ENCOUNTER — Other Ambulatory Visit: Payer: Self-pay

## 2021-05-21 ENCOUNTER — Ambulatory Visit (INDEPENDENT_AMBULATORY_CARE_PROVIDER_SITE_OTHER): Payer: Medicare Other | Admitting: Orthopaedic Surgery

## 2021-05-21 DIAGNOSIS — Z96641 Presence of right artificial hip joint: Secondary | ICD-10-CM | POA: Diagnosis not present

## 2021-05-21 DIAGNOSIS — S73004D Unspecified dislocation of right hip, subsequent encounter: Secondary | ICD-10-CM | POA: Diagnosis not present

## 2021-05-21 NOTE — Progress Notes (Signed)
The patient is an 81 year old female who has both of her hips replaced that was done elsewhere over 20 years ago.  About a year and a half ago she had her first ever dislocation of the right hip when she was bending over awkwardly.  She was sent to me as a referral from the emergency room since I was on call.  She had done well for the last year and a half but has had spine fusion surgery of her lumbar spine due to bilateral leg numbness and stenosis.  She said that helped her quite a bit.  On 28 December she was in awkward position in her laundry room and spilled some laundry detergent.  She bent over awkwardly and dislocated at hip.  She was taken the emergency room and the ER staff was able to reduce her hip.  She is in a knee immobilizer now and ambulating with a cane and adhering to strict posterior hip precautions.  She had had no other incidences of instability.  On exam her hip is located on the right side.  She is in a knee immobilizer.  I did review her x-rays showing her dislocation films and a reduction films.  The metal components appear in good position as well.  If she continues to have instability symptoms of the right hip, we would need to recommend a revision of likely acetabular component with a dual mobility type of component.  She would like to hold off on that since has been so long between her dislocations.  She will continue strict posterior hip precautions.  I would like to see her back in 4 weeks to see how she is doing overall but no x-rays are needed.

## 2021-06-04 ENCOUNTER — Encounter: Payer: Self-pay | Admitting: Orthopaedic Surgery

## 2021-06-04 ENCOUNTER — Other Ambulatory Visit: Payer: Self-pay

## 2021-06-04 ENCOUNTER — Ambulatory Visit (INDEPENDENT_AMBULATORY_CARE_PROVIDER_SITE_OTHER): Payer: Medicare Other | Admitting: Orthopaedic Surgery

## 2021-06-04 VITALS — Ht 69.5 in | Wt 216.0 lb

## 2021-06-04 DIAGNOSIS — Z96641 Presence of right artificial hip joint: Secondary | ICD-10-CM

## 2021-06-04 DIAGNOSIS — S73004D Unspecified dislocation of right hip, subsequent encounter: Secondary | ICD-10-CM | POA: Diagnosis not present

## 2021-06-04 NOTE — Progress Notes (Signed)
The patient is following up with me since I was on call when she had her first right total hip arthroplasty dislocation.  This hip was replaced elsewhere over 20 years ago.  It has never had any issues until she bent over and twisted wrong and the hip came out of place.  She then went for over a year until December 28 of this past month when she bent over way wrong and it dislocated.  The ER staff was able to reduce the hip.  She has been adhering to hip precautions and wearing a knee immobilizer.  On exam her hip is clinically in place and she is feeling better overall and not taking anything for pain.  She will continue to adhere to strict posterior hip precautions on the right hip.  She understands that this becomes a recurrent issue that the revision of her right hip would be warranted.

## 2022-10-21 ENCOUNTER — Emergency Department (HOSPITAL_COMMUNITY)
Admission: EM | Admit: 2022-10-21 | Discharge: 2022-10-21 | Disposition: A | Discharge status: Home / Self Care | Payer: Medicare Other | Attending: Emergency Medicine | Admitting: Emergency Medicine

## 2022-10-21 ENCOUNTER — Emergency Department (HOSPITAL_COMMUNITY): Payer: Medicare Other

## 2022-10-21 ENCOUNTER — Other Ambulatory Visit: Payer: Self-pay

## 2022-10-21 ENCOUNTER — Encounter (HOSPITAL_COMMUNITY): Payer: Self-pay

## 2022-10-21 ENCOUNTER — Ambulatory Visit (HOSPITAL_BASED_OUTPATIENT_CLINIC_OR_DEPARTMENT_OTHER): Payer: Medicare Other | Admitting: Orthopaedic Surgery

## 2022-10-21 DIAGNOSIS — S73004A Unspecified dislocation of right hip, initial encounter: Secondary | ICD-10-CM

## 2022-10-21 DIAGNOSIS — X58XXXA Exposure to other specified factors, initial encounter: Secondary | ICD-10-CM | POA: Insufficient documentation

## 2022-10-21 MED ORDER — PROPOFOL 10 MG/ML IV BOLUS
0.5000 mg/kg | Freq: Once | INTRAVENOUS | Status: DC
  Filled 2022-10-21: qty 20

## 2022-10-21 MED ORDER — FENTANYL CITRATE PF 50 MCG/ML IJ SOSY
50.0000 ug | PREFILLED_SYRINGE | Freq: Once | INTRAMUSCULAR | Status: AC
  Administered 2022-10-21: 50 ug via INTRAVENOUS
  Filled 2022-10-21: qty 1

## 2022-10-21 MED ORDER — PROPOFOL 10 MG/ML IV BOLUS
INTRAVENOUS | Status: DC | PRN
  Administered 2022-10-21 (×3): 20 mg via INTRAVENOUS
  Administered 2022-10-21: 30 mg via INTRAVENOUS
  Administered 2022-10-21: 20 mg via INTRAVENOUS
  Administered 2022-10-21: 50 mg via INTRAVENOUS
  Administered 2022-10-21 (×3): 20 mg via INTRAVENOUS
  Administered 2022-10-21: 50 mg via INTRAVENOUS
  Administered 2022-10-21: 10 mg via INTRAVENOUS
  Administered 2022-10-21: 20 mg via INTRAVENOUS
  Administered 2022-10-21: 10 mg via INTRAVENOUS

## 2022-10-21 MED ORDER — PROPOFOL 10 MG/ML IV BOLUS: 0.5000 mg/kg | Freq: Once | INTRAVENOUS | Status: DC

## 2022-10-21 MED ORDER — PROPOFOL 10 MG/ML IV BOLUS
INTRAVENOUS | Status: AC
  Filled 2022-10-21: qty 20

## 2022-10-21 NOTE — ED Notes (Signed)
Patient ambulated approx 30 ft using cane and wall rail. Patient states she feels that she is safe to go home. Son, who will be driving patient home, contacted at this time.

## 2022-10-21 NOTE — ED Triage Notes (Signed)
Pt BIB by EMS from home, had bilateral hip surgery 22 years ago but dislocated hip today by getting up from chair. 125 mcg of fentanyl given by EMS. Pt states she "felt hip move out of place"

## 2022-10-21 NOTE — ED Notes (Signed)
Knee immobilizer placed by ortho tech. Patient denies pain. Distal CMS intact.

## 2022-10-21 NOTE — ED Notes (Signed)
Patient ambulated to bathroom without difficulty.

## 2022-10-21 NOTE — Procedures (Signed)
The patient was consented and consciously sedated.  This was with the propofol sedation.  The right leg was subsequently given inline traction with internal/external rotation.  There was a satisfying pop with x-rays confirming successful closed reduction.  She was awoken without complication.  There was 0 blood loss.

## 2022-10-21 NOTE — Consult Note (Signed)
ORTHOPAEDIC CONSULTATION  REQUESTING PHYSICIAN: Tilden Fossa, MD  Chief Complaint: Right hip dislocation  HPI: Bonnie Maddox is a 82 y.o. female who presents with right hip dislocation in the setting of a posterior hip replacement done 20 years prior.  She has been following with Dr. Magnus Ivan.  She is subsequently status post 2 closed reductions in the emergency room.  She dislocated overnight at around 11:00 when she is in the recliner and felt the hip pop out.  1 additional reduction attempt was tried in the emergency room.  Orthopedics was consulted this morning for further management  Past Medical History:  Diagnosis Date   Arthritis    Breast cancer ([facility])    Breast cancer of lower-outer quadrant of right female breast ([facility]) 04/06/2014   Hypertension    Personal history of radiation therapy    Wears glasses    Past Surgical History:  Procedure Laterality Date   APPENDECTOMY     BACK SURGERY     BREAST BIOPSY     BREAST LUMPECTOMY Right    2015   BREAST LUMPECTOMY WITH NEEDLE LOCALIZATION AND AXILLARY SENTINEL LYMPH NODE BX Right 05/17/2014   Procedure: RIGHT BREAST LUMPECTOMY WITH RADIOACTIVE SEED LOCALIZATION AND SENTINEL NODE MAPPING;  Surgeon: Chevis Pretty III, MD;  Location: [facility];  Service: General;  Laterality: Right;   COLONOSCOPY     TOTAL HIP ARTHROPLASTY  2001,2002   left and right   TUBAL LIGATION     Social History   Socioeconomic History   Marital status: Married    Spouse name: Not on file   Number of children: 2   Years of education: Not on file   Highest education level: Not on file  Occupational History   Occupation: Retired  Tobacco Use   Smoking status: Former    Packs/day: 1    Types: Cigarettes    Quit date: 04/11/1969    Years since quitting: 53.5   Smokeless tobacco: Never  Vaping Use   Vaping Use: Never used  Substance and Sexual Activity   Alcohol use: Yes    Alcohol/week: 0.0 standard drinks of alcohol     Comment: 21  oz/week   Drug use: No   Sexual activity: Yes  Other Topics Concern   Not on file  Social History Narrative   Not on file   Social Determinants of Health   Financial Resource Strain: Low Risk  (06/28/2017)   Overall Financial Resource Strain (CARDIA)    Difficulty of Paying Living Expenses: Not hard at all  Food Insecurity: No Food Insecurity (06/28/2017)   Hunger Vital Sign    Worried About Running Out of Food in the Last Year: Never true    Ran Out of Food in the Last Year: Never true  Transportation Needs: No Transportation Needs (06/28/2017)   PRAPARE - Administrator, Civil Service (Medical): No    Lack of Transportation (Non-Medical): No  Physical Activity: Insufficiently Active (06/28/2017)   Exercise Vital Sign    Days of Exercise per Week: 3 days    Minutes of Exercise per Session: 30 min  Stress: No Stress Concern Present (06/28/2017)   [employer] of Occupational Health - Occupational Stress Questionnaire    Feeling of Stress : Not at all  Social Connections: Somewhat Isolated (06/28/2017)   Social Connection and Isolation Panel [NHANES]    Frequency of Communication with Friends and Family: More than three times a week    Frequency  of Social Gatherings with Friends and Family: More than three times a week    Attends Religious Services: Never    Database administrator or Organizations: No    Attends Engineer, structural: Never    Marital Status: Married   Family History  Problem Relation Age of Onset   Breast cancer Sister    - negative except otherwise stated in the family history section No Known Allergies Prior to Admission medications   Medication Sig Start Date End Date Taking? Authorizing Provider  acetaminophen (TYLENOL) 500 MG tablet Take 1,000 mg by mouth every 6 (six) hours as needed.    [provider]  Cyanocobalamin (VITAMIN B 12 PO) Take 1,000 mcg by mouth.    [provider]  Magnesium Citrate  200 MG TABS Take 200 mg by mouth daily.     [provider]  meloxicam (MOBIC) 15 MG tablet Take 0.5-1 tablets (7.5-15 mg total) by mouth daily as needed for pain. 10/03/19   Hilts, Casimiro Needle, MD  Multiple Vitamin (MULTIVITAMIN WITH MINERALS) TABS tablet Take 1 tablet by mouth daily.    [provider]  naproxen sodium (ALEVE) 220 MG tablet Take 220 mg by mouth 2 (two) times daily as needed (back pain).    [provider]  traMADol (ULTRAM) 50 MG tablet Take 1 tablet (50 mg total) by mouth 3 (three) times daily as needed. 06/11/20   Hilts, Casimiro Needle, MD   DG HIP PORT UNILAT WITH PELVIS 1V RIGHT  Result Date: 10/21/2022 CLINICAL DATA:  Post reduction of the right hip EXAM: DG HIP (WITH OR WITHOUT PELVIS) 1V PORT RIGHT COMPARISON:  Earlier today FINDINGS: The right hip is relocated in the frontal projection. No evidence of fracture. No evidence of hardware loosening. IMPRESSION: Relocated right hip in the frontal projection. Electronically Signed   By: Tiburcio Pea M.D.   On: 10/21/2022 07:46   DG Hip [place] or [place] Pelvis 1 View Right  Result Date: 10/21/2022 CLINICAL DATA:  Right hip dislocation.Status post attempted reduction. EXAM: DG HIP (WITH OR WITHOUT PELVIS) 1V PORT RIGHT COMPARISON:  Earlier today FINDINGS: Continued right hip dislocation.  No signs of fracture. IMPRESSION: Continued right hip dislocation. Electronically Signed   By: Signa Kell M.D.   On: 10/21/2022 06:15   DG Hip Unilat W or [place] Pelvis 2-3 Views Right  Result Date: 10/21/2022 CLINICAL DATA:  Suspected hip dislocation EXAM: DG HIP (WITH OR WITHOUT PELVIS) 3V RIGHT COMPARISON:  05/14/2021 FINDINGS: Bilateral total hip arthroplasty with right dislocation. No acute fracture or detected loosening. The right acetabular cup has a stable orientation. Extensive lower lumbar fusion, partially covered. Osteopenia. IMPRESSION: Dislocated right hip prosthesis. Electronically Signed   By: Tiburcio Pea M.D.    On: 10/21/2022 05:05     Positive ROS: All other systems have been reviewed and were otherwise negative with the exception of those mentioned in the HPI and as above.  Physical Exam: General: No acute distress Cardiovascular: No pedal edema Respiratory: No cyanosis, no use of accessory musculature GI: No organomegaly, abdomen is soft and non-tender Skin: No lesions in the area of chief complaint Neurologic: Sensation intact distally Psychiatric: Patient is at baseline mood and affect Lymphatic: No axillary or cervical lymphadenopathy  MUSCULOSKELETAL:  Right lower extremity is internally rotated and shortened.  Prior to reduction this was equalized with limb lengths.  2+ dorsalis pedis pulse bilaterally.  Fires EHL tibialis anterior strongly without sciatic paresthesias  Independent Imaging Review:  3 views right hip: Posterior hip dislocation status post successful closed reduction  Assessment: 82 year old female who is status post right total hip replacement with posterior dislocation.  This was closed reduced successfully in the emergency room for a third time.  Will plan to get her in a knee immobilizer and follow-up with Dr. Magnus Ivan.  At that point they will discuss possible  Plan: Status post closed reduction successful of the right hip.  Plan to follow-up with Dr. Magnus Ivan with posterior hip precautions  Huel Cote, MD [facility] 8:07 AM

## 2022-10-21 NOTE — ED Provider Notes (Signed)
[place] EMERGENCY DEPARTMENT AT [facility] Provider Note   CSN: [id number] Arrival date & time: 10/21/22  0423     History  Chief Complaint  Patient presents with   Dislocation    hip    Bonnie Maddox is a 82 y.o. female.  The history is provided by the patient and the EMS personnel.  Bonnie Maddox is a 82 y.o. female who presents to the Emergency Department complaining of I think my hip is dislocated.  She presents to the emergency department by EMS due to concern for hip dislocation.  She has a history of bilateral hip replacements and has had a spontaneous dislocations in the past.  She states that around 11:00 she tried to get out of her recliner when she felt her hip moving the wrong direction and experienced pain to her right groin.  She has been a unable to stand or range the hip since that time.  No reported recent illnesses.  She has no significant medical problems.  Received 125 mcg of fentanyl by EMS prior to ED arrival.    Home Medications Prior to Admission medications   Medication Sig Start Date End Date Taking? Authorizing Provider  acetaminophen (TYLENOL) 500 MG tablet Take 1,000 mg by mouth every 6 (six) hours as needed.    [provider]  Cyanocobalamin (VITAMIN B 12 PO) Take 1,000 mcg by mouth.    [provider]  Magnesium Citrate 200 MG TABS Take 200 mg by mouth daily.     [provider]  meloxicam (MOBIC) 15 MG tablet Take 0.5-1 tablets (7.5-15 mg total) by mouth daily as needed for pain. 10/03/19   Hilts, Casimiro Needle, MD  Multiple Vitamin (MULTIVITAMIN WITH MINERALS) TABS tablet Take 1 tablet by mouth daily.    [provider]  naproxen sodium (ALEVE) 220 MG tablet Take 220 mg by mouth 2 (two) times daily as needed (back pain).    [provider]  traMADol (ULTRAM) 50 MG tablet Take 1 tablet (50 mg total) by mouth 3 (three) times daily as needed. 06/11/20   Hilts, Casimiro Needle, MD      Allergies    Patient  has no known allergies.    Review of Systems   Review of Systems  All other systems reviewed and are negative.   Physical Exam Updated Vital Signs BP (!) 144/77   Pulse 72   Temp 98.3 F (36.8 C) (Oral)   Resp 18   Ht 5' 9.5" (1.765 m)   Wt 95.3 kg   SpO2 100%   BMI 30.57 kg/m  Physical Exam Vitals and nursing note reviewed.  Constitutional:      Appearance: She is well-developed.  HENT:     Head: Normocephalic and atraumatic.  Cardiovascular:     Rate and Rhythm: Normal rate and regular rhythm.     Heart sounds: No murmur heard. Pulmonary:     Effort: Pulmonary effort is normal. No respiratory distress.     Breath sounds: Normal breath sounds.  Abdominal:     Palpations: Abdomen is soft.     Tenderness: There is no abdominal tenderness. There is no guarding or rebound.  Musculoskeletal:     Comments: Right lower extremity is shortened and internally rotated.  2+ DP pulses bilaterally.  There is pitting edema to bilateral lower extremities.  Skin:    General: Skin is warm and dry.  Neurological:     Mental Status: She is alert and oriented to person,  place, and time.  Psychiatric:        Behavior: Behavior normal.     ED Results / Procedures / Treatments   Labs (all labs ordered are listed, but only abnormal results are displayed) Labs Reviewed - No data to display  EKG None  Radiology DG HIP PORT UNILAT WITH PELVIS 1V RIGHT  Result Date: 10/21/2022 CLINICAL DATA:  Post reduction of the right hip EXAM: DG HIP (WITH OR WITHOUT PELVIS) 1V PORT RIGHT COMPARISON:  Earlier today FINDINGS: The right hip is relocated in the frontal projection. No evidence of fracture. No evidence of hardware loosening. IMPRESSION: Relocated right hip in the frontal projection. Electronically Signed   By: Tiburcio Pea M.D.   On: 10/21/2022 07:46   DG Hip [place] or [place] Pelvis 1 View Right  Result Date: 10/21/2022 CLINICAL DATA:  Right hip dislocation.Status post attempted  reduction. EXAM: DG HIP (WITH OR WITHOUT PELVIS) 1V PORT RIGHT COMPARISON:  Earlier today FINDINGS: Continued right hip dislocation.  No signs of fracture. IMPRESSION: Continued right hip dislocation. Electronically Signed   By: Signa Kell M.D.   On: 10/21/2022 06:15   DG Hip Unilat W or Wo Pelvis 2-3 Views Right  Result Date: 10/21/2022 CLINICAL DATA:  Suspected hip dislocation EXAM: DG HIP (WITH OR WITHOUT PELVIS) 3V RIGHT COMPARISON:  05/14/2021 FINDINGS: Bilateral total hip arthroplasty with right dislocation. No acute fracture or detected loosening. The right acetabular cup has a stable orientation. Extensive lower lumbar fusion, partially covered. Osteopenia. IMPRESSION: Dislocated right hip prosthesis. Electronically Signed   By: Tiburcio Pea M.D.   On: 10/21/2022 05:05    Procedures .Sedation  Date/Time: 10/21/2022 7:41 AM  Performed by: Tilden Fossa, MD Authorized by: Tilden Fossa, MD   Consent:    Consent obtained:  Verbal   Consent given by:  Patient   Risks discussed:  Allergic reaction, prolonged hypoxia resulting in organ damage, dysrhythmia, inadequate sedation, respiratory compromise necessitating ventilatory assistance and intubation, nausea and vomiting   Alternatives discussed:  Analgesia without sedation Universal protocol:    Immediately prior to procedure, a time out was called: yes   Indications:    Procedure performed:  Dislocation reduction   Procedure necessitating sedation performed by:  Different physician Pre-sedation assessment:    Time since last food or drink:  8   ASA classification: class 2 - patient with mild systemic disease     Mallampati score:  II - soft palate, uvula, fauces visible   Neck mobility: normal     Pre-sedation assessments completed and reviewed: airway patency, cardiovascular function, hydration status, mental status, nausea/vomiting, pain level, respiratory function and temperature   Immediate pre-procedure details:     Reviewed: vital signs     Verified: bag valve mask available, emergency equipment available, intubation equipment available, IV patency confirmed and oxygen available   Procedure details (see MAR for exact dosages):    Preoxygenation:  Nasal cannula   Sedation:  Propofol   Intended level of sedation: deep   Analgesia:  Fentanyl   Intra-procedure monitoring:  Blood pressure monitoring, continuous capnometry, frequent LOC assessments, frequent vital sign checks, continuous pulse oximetry and cardiac monitor   Intra-procedure events: none     Total Provider sedation time (minutes):  40 Post-procedure details:    Post-sedation assessment completed:  10/21/2022 7:43 AM   Procedure completion:  Tolerated well, no immediate complications     Medications Ordered in ED Medications  propofol (DIPRIVAN) 10 mg/mL bolus/IV push 47.7 mg (  has no administration in time range)  propofol (DIPRIVAN) 10 mg/mL bolus/IV push (has no administration in time range)  propofol (DIPRIVAN) 10 mg/mL bolus/IV push 47.7 mg (has no administration in time range)  fentaNYL (SUBLIMAZE) injection 50 mcg (50 mcg Intravenous Given 10/21/22 0438)  fentaNYL (SUBLIMAZE) injection 50 mcg (50 mcg Intravenous Given 10/21/22 0640)    ED Course/ Medical Decision Making/ A&P                             Medical Decision Making Amount and/or Complexity of Data Reviewed Radiology: ordered.  Risk Prescription drug management.   Pt here for evaluation of spontaneous right hip dislocation, NVI.  Initial attempt at reduction performed by ED PA with propofol sedation performed by myself.  Initial reduction attempt was not successful.  D/w Dr. Steward Drone with orthopedics - he evaluated the patient in the ED and was abe to reduce the hip with sedation performed by myself.  Pt tolerated procedure well.  Post reduction films demonstrate hip in good position.  Images personally reviewed and interpreted - agree with radiologist interpretation.  Plan  to d/c with knee immobilizer in place with outpatient orthopedics follow up.  Discussed plan with patient as well as her son over the phone.          Final Clinical Impression(s) / ED Diagnoses Final diagnoses:  Hip dislocation, right, initial encounter [facility])    Rx / DC Orders ED Discharge Orders     None         Tilden Fossa, MD 10/21/22 [id number]

## 2022-10-21 NOTE — Progress Notes (Signed)
Orthopedic Tech Progress Note Patient Details:  Bonnie Maddox 10-20-1940 [id number]  Ortho Devices Type of Ortho Device: Knee Immobilizer Ortho Device/Splint Location: Right knee Ortho Device/Splint Interventions: Application   Post Interventions Patient Tolerated: Well  Loran Auguste E Taishaun Levels 10/21/2022, 8:16 AM

## 2022-11-30 ENCOUNTER — Encounter: Payer: Self-pay | Admitting: Orthopaedic Surgery

## 2022-11-30 ENCOUNTER — Ambulatory Visit: Payer: Medicare Other | Admitting: Orthopaedic Surgery

## 2022-11-30 DIAGNOSIS — Z96641 Presence of right artificial hip joint: Secondary | ICD-10-CM

## 2022-11-30 DIAGNOSIS — S73004D Unspecified dislocation of right hip, subsequent encounter: Secondary | ICD-10-CM

## 2022-11-30 NOTE — Progress Notes (Signed)
The patient is a pleasant 82 year old female that I have seen in the past.  She originally had a right posterior hip replacement performed in 2002 elsewhere.  She had done well for a long period time but has now had 3 dislocations over the last 5 years with the most recent being in early June.  She is strict with adhering to posterior hip precautions at this standpoint.  She is ambulate with a cane.  She has looked up online some things that she can do to strengthen her hip.  However this is concerning but it has now come out 3 times.  Her plain films show a well-seated total hip arthroplasty from after it was reduced.  The femoral component is cemented stem and acetabular component does not appear to be significantly malaligned.  On exam today she is in place in terms of her hip replacement and overall is feeling well but at this point would like certainly opinion as to the potential for surgical intervention that could keep this from happening again.  I would like to send her to one of my colleagues in town Dr. Ivory Broad with [facility] to get his opinion as to what surgical intervention would be appropriate.  I will then perform anterior hip replacement surgery and I feel that she has had posterior surgery and would benefit from evaluation by a fellowship trained joint reconstruction specialist who is well-trained in posterior hip surgery.  She agrees with this referral.  Will see if we can get him to see her.

## 2022-11-30 NOTE — Addendum Note (Signed)
Addended by: Rogers Seeds on: 11/30/2022 05:22 PM   Modules accepted: Orders

## 2022-12-03 ENCOUNTER — Telehealth: Payer: Self-pay | Admitting: Orthopaedic Surgery

## 2022-12-03 NOTE — Telephone Encounter (Signed)
Pt called dr. Kyra Leyland office and they stated they have not received the referral we sent over please advise

## 2023-04-14 ENCOUNTER — Ambulatory Visit: Payer: Medicare Other | Admitting: Family Medicine

## 2023-04-14 ENCOUNTER — Encounter: Payer: Self-pay | Admitting: Family Medicine

## 2023-04-14 VITALS — BP 140/90 | HR 67 | Temp 97.5°F | Ht 69.0 in | Wt 212.9 lb

## 2023-04-14 DIAGNOSIS — Z780 Asymptomatic menopausal state: Secondary | ICD-10-CM

## 2023-04-14 DIAGNOSIS — Z131 Encounter for screening for diabetes mellitus: Secondary | ICD-10-CM

## 2023-04-14 DIAGNOSIS — R030 Elevated blood-pressure reading, without diagnosis of hypertension: Secondary | ICD-10-CM

## 2023-04-14 DIAGNOSIS — Z0000 Encounter for general adult medical examination without abnormal findings: Secondary | ICD-10-CM

## 2023-04-14 DIAGNOSIS — Z1231 Encounter for screening mammogram for malignant neoplasm of breast: Secondary | ICD-10-CM

## 2023-04-14 DIAGNOSIS — Z13220 Encounter for screening for lipoid disorders: Secondary | ICD-10-CM | POA: Diagnosis not present

## 2023-04-14 LAB — COMPREHENSIVE METABOLIC PANEL
ALT: 18 U/L (ref 0–35)
AST: 24 U/L (ref 0–37)
Albumin: 4.7 g/dL (ref 3.5–5.2)
Alkaline Phosphatase: 70 U/L (ref 39–117)
BUN: 13 mg/dL (ref 6–23)
CO2: 28 meq/L (ref 19–32)
Calcium: 10 mg/dL (ref 8.4–10.5)
Chloride: 104 meq/L (ref 96–112)
Creatinine, Ser: 0.58 mg/dL (ref 0.40–1.20)
GFR: 83.6 mL/min (ref >60.00)
Glucose, Bld: 113 mg/dL — ABNORMAL HIGH (ref 70–99)
Potassium: 3.9 meq/L (ref 3.5–5.1)
Sodium: 140 meq/L (ref 135–145)
Total Bilirubin: 0.9 mg/dL (ref 0.2–1.2)
Total Protein: 7.2 g/dL (ref 6.0–8.3)

## 2023-04-14 LAB — LIPID PANEL
Cholesterol: 218 mg/dL — ABNORMAL HIGH (ref 0–200)
HDL: 67.7 mg/dL (ref >39.00)
LDL Cholesterol: 132 mg/dL — ABNORMAL HIGH (ref 0–99)
NonHDL: 150.4
Total CHOL/HDL Ratio: 3
Triglycerides: 90 mg/dL (ref 0.0–149.0)
VLDL: 18 mg/dL (ref 0.0–40.0)

## 2023-04-14 LAB — HEMOGLOBIN A1C: Hgb A1c MFr Bld: 5.9 % (ref 4.6–6.5)

## 2023-04-14 NOTE — Progress Notes (Signed)
Subjective:   Bonnie Maddox is a 82 y.o. female who presents for Medicare Annual (Subsequent) preventive examination.  Visit Complete: In person  Patient Medicare AWV questionnaire was completed by the patient on 04/14/23; I have confirmed that all information answered by patient is correct and no changes since this date.   Bonnie Maddox presents to establish care Patient was previously seeing someone in [facility]-- Dr. Jeanice Lim-- last visit was in 2019 with her.   Patient states she used to be on blood pressure medication in the past. States that she stopped taking her blood pressure medication some time ago, states that she occasionally checks her blood pressure at home and at home is appears to run in the 130's. States that she was seen by the cardiologist in 2022 -- had an ECHO and stress test and she was told he "wasn't concerned" about her blood pressure.   Pt states that she has had hip and back problems, states she had to have surgery on her back which was successful and her pain is much better. States that she has suffered hip dislocations, is seeing Dr. Magnus Ivan regularly. States that she will bee seeing a different doctor to get a second opinion who did not recommend surgery. States that he recommended physical therapy and she has completed her course. She reports that she feels much better, more strong and feels that her hip is improved.  Pt reports a history of breast cancer -- had lumpectomy and radiation, no chemo. States she has been cancer free since 2015. Has been getting regular mammograms but missed a few recently due to COVID.     Objective:    Today's Vitals   04/14/23 1101  BP: (!) 140/90  Pulse: 67  Temp: (!) 97.5 F (36.4 C)  TempSrc: Oral  SpO2: 98%  Weight: 212 lb 14.4 oz (96.6 kg)  Height: 5\' 9"  (1.753 m)   Body mass index is 31.44 kg/m.     04/14/2023   11:39 AM 10/21/2022    4:32 AM 05/14/2021    3:48 PM 07/13/2014   11:17 AM 06/07/2014    9:42  AM 05/17/2014    8:10 AM 05/14/2014   11:36 AM  Advanced Directives  Does Patient Have a Medical Advance Directive? Yes No No No No No No  Type of Advance Directive Healthcare Power of Attorney        Does patient want to make changes to medical advance directive? Yes (MAU/Ambulatory/Procedural Areas - Information given)        Would patient like information on creating a medical advance directive?  No - Patient declined   No - patient declined information  No - patient declined information    Current Medications (verified) Outpatient Encounter Medications as of 04/14/2023  Medication Sig   cholecalciferol (VITAMIN D3) 25 MCG (1000 UNIT) tablet Take 1,000 Units by mouth daily.   Cyanocobalamin (VITAMIN B 12 PO) Take 1,000 mcg by mouth.   Magnesium Citrate 200 MG TABS Take 200 mg by mouth daily.    Multiple Vitamin (MULTIVITAMIN WITH MINERALS) TABS tablet Take 1 tablet by mouth daily.   [DISCONTINUED] acetaminophen (TYLENOL) 500 MG tablet Take 1,000 mg by mouth every 6 (six) hours as needed.   [DISCONTINUED] meloxicam (MOBIC) 15 MG tablet Take 0.5-1 tablets (7.5-15 mg total) by mouth daily as needed for pain.   [DISCONTINUED] naproxen sodium (ALEVE) 220 MG tablet Take 220 mg by mouth 2 (two) times daily as needed (back pain).   [  DISCONTINUED] traMADol (ULTRAM) 50 MG tablet Take 1 tablet (50 mg total) by mouth 3 (three) times daily as needed.   No facility-administered encounter medications on file as of 04/14/2023.    Allergies (verified) Patient has no known allergies.   History: Past Medical History:  Diagnosis Date   Arthritis    Breast cancer (HCC)    Breast cancer of lower-outer quadrant of right female breast (HCC) 04/06/2014   Hypertension    Personal history of radiation therapy    Wears glasses    Past Surgical History:  Procedure Laterality Date   APPENDECTOMY     BACK SURGERY     BREAST BIOPSY     BREAST LUMPECTOMY Right    2015   BREAST LUMPECTOMY WITH NEEDLE  LOCALIZATION AND AXILLARY SENTINEL LYMPH NODE BX Right 05/17/2014   Procedure: RIGHT BREAST LUMPECTOMY WITH RADIOACTIVE SEED LOCALIZATION AND SENTINEL NODE MAPPING;  Surgeon: Chevis Pretty III, MD;  Location: [facility];  Service: General;  Laterality: Right;   COLONOSCOPY     TOTAL HIP ARTHROPLASTY  2001,2002   left and right   TUBAL LIGATION     Family History  Problem Relation Age of Onset   Breast cancer Sister    Social History   Socioeconomic History   Marital status: Widowed    Spouse name: Not on file   Number of children: 2   Years of education: Not on file   Highest education level: Some college, no degree  Occupational History   Occupation: Retired  Tobacco Use   Smoking status: Former    Current packs/day: 0.00    Types: Cigarettes    Quit date: 04/11/1969    Years since quitting: 54.0   Smokeless tobacco: Never  Vaping Use   Vaping status: Never Used  Substance and Sexual Activity   Alcohol use: Yes    Alcohol/week: 0.0 standard drinks of alcohol    Comment: 21  oz/week   Drug use: Never   Sexual activity: Not Currently  Other Topics Concern   Not on file  Social History Narrative   Not on file   Social Determinants of Health   Financial Resource Strain: Low Risk  (04/10/2023)   Overall Financial Resource Strain (CARDIA)    Difficulty of Paying Living Expenses: Not very hard  Food Insecurity: No Food Insecurity (04/10/2023)   Hunger Vital Sign    Worried About Running Out of Food in the Last Year: Never true    Ran Out of Food in the Last Year: Never true  Transportation Needs: No Transportation Needs (04/10/2023)   PRAPARE - Administrator, Civil Service (Medical): No    Lack of Transportation (Non-Medical): No  Physical Activity: Sufficiently Active (04/10/2023)   Exercise Vital Sign    Days of Exercise per Week: 5 days    Minutes of Exercise per Session: 50 min  Stress: No Stress Concern Present (04/10/2023)   [employer] of Occupational Health - Occupational Stress Questionnaire    Feeling of Stress : Not at all  Social Connections: Socially Isolated (04/10/2023)   Social Connection and Isolation Panel [NHANES]    Frequency of Communication with Friends and Family: Once a week    Frequency of Social Gatherings with Friends and Family: Once a week    Attends Religious Services: Never    Database administrator or Organizations: No    Attends Engineer, structural: Not on file    Marital Status: Widowed  Tobacco Counseling Counseling given: Not Answered   Activities of Daily Living    04/14/2023   11:37 AM  In your present state of health, do you have any difficulty performing the following activities:  Hearing? 1  Vision? 0  Difficulty concentrating or making decisions? 0  Walking or climbing stairs? 0  Dressing or bathing? 0  Doing errands, shopping? 0    Patient Care Team: Karie Georges, MD as PCP - General (Family Medicine) Jake Bathe, MD as PCP - Cardiology (Cardiology) Griselda Miner, MD as Consulting Physician (General Surgery) Malachy Mood, MD as Consulting Physician (Hematology) Lurline Hare, MD as Consulting Physician (Radiation Oncology) Hubbard Hartshorn, NP (Inactive) as Nurse Practitioner (Nurse Practitioner)  Indicate any recent Medical Services you may have received from other than [facility] providers in the past year (date may be approximate).     Assessment:   This is a routine wellness examination for [place].  Hearing/Vision screen No results found.   Goals Addressed   None   Depression Screen    04/14/2023   11:02 AM 06/28/2017    9:34 AM  PHQ 2/9 Scores  PHQ - 2 Score 0 0  PHQ- 9 Score 0 0    Fall Risk    04/14/2023   10:58 AM 12/19/2018    1:25 PM 06/28/2017    9:34 AM  Fall Risk   Falls in the past year? 0 0 No  Comment  Emmi Telephone Survey: data to providers prior to load   Number falls in past yr: 0    Injury with Fall? 0     Risk for fall due to : Impaired balance/gait;Impaired mobility    Follow up Falls evaluation completed      MEDICARE RISK AT HOME:    TIMED UP AND GO:  Was the test performed?  Yes  Length of time to ambulate 10 feet: 5 sec Gait steady and fast without use of assistive device    Cognitive Function:        04/14/2023   11:35 AM  6CIT Screen  What Year? 0 points  What month? 0 points  What time? 0 points  Count back from 20 2 points  Months in reverse 0 points  Repeat phrase 0 points  Total Score 2 points    Immunizations Immunization History  Administered Date(s) Administered   Influenza, High Dose Seasonal PF 03/18/2017   Influenza-Unspecified 02/15/2018, 03/12/2020, 03/10/2023   PFIZER(Purple Top)SARS-COV-2 Vaccination 05/27/2019, 06/17/2019, 03/07/2020, 10/07/2020   Pfizer Covid-19 Vaccine Bivalent Booster 76yrs & up 02/15/2022   Pneumococcal Conjugate-13 07/06/2016   Pneumococcal Polysaccharide-23 11/23/2011   RSV,unspecified 02/15/2022   Tdap 11/23/2013   Zoster Recombinant(Shingrix) 04/17/2022, 05/18/2022    TDAP status: Up to date  Flu Vaccine status: Up to date  Pneumococcal vaccine status: Up to date  Covid-19 vaccine status: Information provided on how to obtain vaccines.   Shingrix Completed?: Yes  Screening Tests Health Maintenance  Topic Date Due   DEXA SCAN  Never done   COVID-19 Vaccine (6 - 2023-24 season) 01/17/2023   DTaP/Tdap/Td (2 - Td or Tdap) 11/24/2023   Medicare Annual Wellness (AWV)  04/13/2024   Pneumonia Vaccine 61+ Years old  Completed   INFLUENZA VACCINE  Completed   Zoster Vaccines- Shingrix  Completed   HPV VACCINES  Aged Out    Health Maintenance  Health Maintenance Due  Topic Date Due   DEXA SCAN  Never done   COVID-19 Vaccine (6 - 2023-24  season) 01/17/2023    Colorectal cancer screening: No longer required.   Mammogram status: Ordered 04/14/23. Pt provided with contact info and advised to call to  schedule appt.   Bone Density status: Ordered 04/14/23. Pt provided with contact info and advised to call to schedule appt.  Lung Cancer Screening: (Low Dose CT Chest recommended if Age 78-80 years, 20 pack-year currently smoking OR have quit w/in 15years.) does not qualify.   Additional Screening:  Hepatitis C Screening: does not qualify  Vision Screening: Recommended annual ophthalmology exams for early detection of glaucoma and other disorders of the eye. Is the patient up to date with their annual eye exam?  Yes  Who is the provider or what is the name of the office in which the patient attends annual eye exams? Dr. Randon Goldsmith  on [facility].  Dental Screening: Recommended annual dental exams for proper oral hygiene --[facility]    Community Resource Referral / Chronic Care Management: CRR required this visit?  No   CCM required this visit?  No     Plan:     I have personally reviewed and noted the following in the patient's chart:   Medical and social history Use of alcohol, tobacco or illicit drugs  Current medications and supplements including opioid prescriptions. Patient is not currently taking opioid prescriptions. Functional ability and status Nutritional status Physical activity Advanced directives List of other physicians Hospitalizations, surgeries, and ER visits in previous 12 months Vitals Screenings to include cognitive, depression, and falls Referrals and appointments  In addition, I have reviewed and discussed with patient certain preventive protocols, quality metrics, and best practice recommendations. A written personalized care plan for preventive services as well as general preventive health recommendations were provided to patient.     Karie Georges, MD   04/14/2023   After Visit Summary: (In Person-Printed) AVS printed and given to the patient

## 2023-05-25 ENCOUNTER — Ambulatory Visit: Payer: Medicare Other

## 2023-06-02 ENCOUNTER — Ambulatory Visit
Admission: RE | Admit: 2023-06-02 | Discharge: 2023-06-02 | Disposition: A | Discharge status: Home / Self Care | Payer: Medicare Other | Source: Ambulatory Visit | Attending: Family Medicine | Admitting: Family Medicine

## 2023-06-02 DIAGNOSIS — Z1231 Encounter for screening mammogram for malignant neoplasm of breast: Secondary | ICD-10-CM

## 2024-04-25 ENCOUNTER — Ambulatory Visit: Admitting: Family Medicine

## 2024-04-27 ENCOUNTER — Other Ambulatory Visit: Payer: Self-pay | Admitting: Family Medicine

## 2024-04-27 DIAGNOSIS — Z1231 Encounter for screening mammogram for malignant neoplasm of breast: Secondary | ICD-10-CM

## 2024-06-02 ENCOUNTER — Ambulatory Visit

## 2024-06-20 ENCOUNTER — Ambulatory Visit

## 2024-07-12 ENCOUNTER — Ambulatory Visit

## 2024-07-24 ENCOUNTER — Ambulatory Visit
